# Patient Record
Sex: Female | Born: 1985 | Race: Black or African American | Hispanic: No | Marital: Single | Smoking: Never smoker
Health system: Southern US, Community
[De-identification: ages and names within clinical notes are randomized; demographics above are authoritative.]

---

## 2017-01-04 ENCOUNTER — Ambulatory Visit: Payer: Self-pay | Admitting: Family Medicine

## 2017-01-18 ENCOUNTER — Ambulatory Visit: Payer: Self-pay | Admitting: Family Medicine

## 2017-02-01 ENCOUNTER — Ambulatory Visit (INDEPENDENT_AMBULATORY_CARE_PROVIDER_SITE_OTHER): Payer: BC Managed Care – PPO | Admitting: Family Medicine

## 2017-02-01 ENCOUNTER — Encounter: Payer: Self-pay | Admitting: Family Medicine

## 2017-02-01 VITALS — BP 131/73 | HR 97 | Temp 98.4°F | Resp 16 | Ht 64.0 in | Wt 238.6 lb

## 2017-02-01 DIAGNOSIS — Z111 Encounter for screening for respiratory tuberculosis: Secondary | ICD-10-CM

## 2017-02-01 DIAGNOSIS — Z124 Encounter for screening for malignant neoplasm of cervix: Secondary | ICD-10-CM | POA: Diagnosis not present

## 2017-02-01 DIAGNOSIS — Z0000 Encounter for general adult medical examination without abnormal findings: Secondary | ICD-10-CM

## 2017-02-01 MED ORDER — CIPROFLOXACIN HCL 250 MG PO TABS
250.0000 mg | ORAL_TABLET | Freq: Two times a day (BID) | ORAL | 0 refills | Status: AC
Start: 2017-02-01 — End: 2017-02-04

## 2017-02-01 MED ORDER — FLUCONAZOLE 150 MG PO TABS: 150.0000 mg | ORAL_TABLET | Freq: Every day | ORAL | 0 refills | Status: DC

## 2017-02-01 NOTE — Patient Instructions (Addendum)
Antibiotic Medicine, Adult Antibiotic medicines treat infections caused by a type of germ called bacteria. They work by killing the bacteria that make you sick. When do I need to take antibiotics? You often need these medicines to treat bacterial infections, such as:  A urinary tract infection (UTI).  Strep throat.  Meningitis. This affects the spinal cord and brain.  A bad lung infection. You may start the medicines while your doctor waits for tests to come back. When the tests come back, your doctor may change or stop your medicine. When are antibiotics not needed? You do not need these medicines for most common illnesses, such as:  A cold.  The flu.  A sore throat. Antibiotics are not always needed for all infections caused by bacteria. Do not ask for these medicines, or take them, when they are not needed. What are the risks of taking antibiotics? Most antibiotics can cause an infection called Clostridium difficile.This causes watery poop (diarrhea). Let your doctor know right away if:  You have watery poop while taking an antibiotic.  You have watery poop after you stop taking an antibiotic. The illness can happen weeks after you stop the medicine. You also have a risk of getting an infection in the future that antibiotics cannot treat (antibiotic-resistant infection). This type of infection can be dangerous. What else should I know about taking antibiotics?  You need to take the entire prescription.  Take the medicine for as long as told by your doctor.  Do not stop taking it even if you start to feel better.  Try not to miss any doses. If you miss a dose, call your doctor.  Birth control pills may not work. If you take birth control pills:  Keep on taking them.  Use a second form of birth control, such as a condom. Do this for as long as told by your doctor.  Ask your doctor:  How long to wait in between doses.  If you should take the medicine with food.  If  there is anything you should stay away from while taking the antibiotic, such as:  Food.  Drinks.  Medicines.  If there are any side effects you should watch for.  Only take the medicines that your doctor told you to take. Do not take medicines that were given to someone else.  Drink a large glass of water with the medicine.  Ask the pharmacist for a tool to measure the medicine, such as:  A syringe.  A cup.  A spoon.  Throw away any extra medicine. Contact a doctor if:  You get worse.  You have new joint pain or muscle aches after starting the medicine.  You have side effects from the medicine, such as:  Stomach pain.  Watery poop.  Feeling sick to your stomach (nausea). Get help right away if:  You have signs of a very bad allergic reaction. If this happens, stop taking the medicine right away. Signs may include:  Hives. These are raised, itchy, red bumps on the skin.  Skin rash.  Trouble breathing.  Wheezing.  Swelling.  Feeling dizzy.  Throwing up (vomiting).  Your pee (urine) is dark, or is the color of blood.  Your skin turns yellow.  You bruise easily.  You bleed easily.  You have very bad watery poop and cramps in your belly.  You have a very bad headache. Summary  Antibiotics are often used to treat infections caused by bacteria.  Only take these medicines when needed.  Let your doctor know if you have watery poop while taking an antibiotic.  You need to take the entire prescription. This information is not intended to replace advice given to you by your health care provider. Make sure you discuss any questions you have with your health care provider. Document Released: 08/09/2008 Document Revised: 11/02/2016 Document Reviewed: 11/02/2016 Elsevier Interactive Patient Education  2017 [employer] Inc[employer]  Urinary Tract Infection, Adult A urinary tract infection (UTI) is an infection of any part of the urinary tract. The urinary tract  includes the:  Kidneys.  Ureters.  Bladder.  Urethra. These organs make, store, and get rid of pee (urine) in the body. Follow these instructions at home:  Take over-the-counter and prescription medicines only as told by your doctor.  If you were prescribed an antibiotic medicine, take it as told by your doctor. Do not stop taking the antibiotic even if you start to feel better.  Avoid the following drinks:  Alcohol.  Caffeine.  Tea.  Carbonated drinks.  Drink enough fluid to keep your pee clear or pale yellow.  Keep all follow-up visits as told by your doctor. This is important.  Make sure to:  Empty your bladder often and completely. Do not to hold pee for long periods of time.  Empty your bladder before and after sex.  Wipe from front to back after a bowel movement if you are female. Use each tissue one time when you wipe. Contact a doctor if:  You have back pain.  You have a fever.  You feel sick to your stomach (nauseous).  You throw up (vomit).  Your symptoms do not get better after 3 days.  Your symptoms go away and then come back. Get help right away if:  You have very bad back pain.  You have very bad lower belly (abdominal) pain.  You are throwing up and cannot keep down any medicines or water. This information is not intended to replace advice given to you by your health care provider. Make sure you discuss any questions you have with your health care provider. Document Released: 04/18/2008 Document Revised: 04/07/2016 Document Reviewed: 09/21/2015 Elsevier Interactive Patient Education  2017 [employer]

## 2017-02-01 NOTE — Progress Notes (Signed)
Chief Complaint  Patient presents with  . Annual Exam    No pap     Subjective:  Ashlee Fisher is a 31 y.o. female here for a health maintenance visit.  Patient is new pt  Patient Active Problem List   Diagnosis Date Noted  . Morbid obesity (HCC) 02/01/2017    No past medical history on file.  No past surgical history on file.   No outpatient prescriptions prior to visit.   No facility-administered medications prior to visit.     No Known Allergies   No family history on file.   Health Habits: Dental Exam: up to date Eye Exam: up to date   Social History   Social History  . Marital status: Single    Spouse name: N/A  . Number of children: N/A  . Years of education: N/A   Occupational History  . Not on file.   Social History Main Topics  . Smoking status: Never Smoker  . Smokeless tobacco: Never Used  . Alcohol use No  . Drug use: No  . Sexual activity: Not on file   Other Topics Concern  . Not on file   Social History Narrative  . No narrative on file   History  Alcohol Use No   History  Smoking Status  . Never Smoker  Smokeless Tobacco  . Never Used   History  Drug Use No    GYN: Sexual Health Menstrual status: regular menses LMP: Patient's last menstrual period was 01/19/2017. Last pap smear: see HM section History of abnormal pap smears: none Sexually active: with female partner Current contraception: condom  Health Maintenance: See under health Maintenance activity for review of completion dates as well. Immunization History  Administered Date(s) Administered  . PPD Test 02/01/2017      Depression Screen-PHQ2/9 Depression screen PHQ 2/9 02/01/2017  Decreased Interest 0  Down, Depressed, Hopeless 0  PHQ - 2 Score 0       Depression Severity and Treatment Recommendations:  0-4= None  5-9= Mild / Treatment: Support, educate to call if worse; return in one month  10-14= Moderate / Treatment: Support, watchful  waiting; Antidepressant or Psycotherapy  15-19= Moderately severe / Treatment: Antidepressant OR Psychotherapy  >= 20 = Major depression, severe / Antidepressant AND Psychotherapy    Review of Systems   Review of Systems  Constitutional: Negative for chills, fever, malaise/fatigue and weight loss.  HENT: Negative for ear discharge, ear pain, hearing loss and tinnitus.   Eyes: Negative for blurred vision and double vision.  Respiratory: Negative for cough, hemoptysis, sputum production, shortness of breath and wheezing.   Cardiovascular: Negative for chest pain, palpitations, orthopnea and claudication.  Gastrointestinal: Negative for heartburn, nausea and vomiting.  Genitourinary: Negative for dysuria, frequency, hematuria and urgency.  Musculoskeletal: Negative for back pain, myalgias and neck pain.  Skin: Negative for itching and rash.  Neurological: Negative for dizziness, tingling, tremors, sensory change, speech change and headaches.  Psychiatric/Behavioral: Negative for depression, hallucinations, substance abuse and suicidal ideas. The patient is not nervous/anxious.     See HPI for ROS as well.    Objective:   Vitals:   02/01/17 1725  BP: 131/73  Pulse: 97  Resp: 16  Temp: 98.4 F (36.9 C)  TempSrc: Oral  SpO2: 99%  Weight: 238 lb 9.6 oz (108.2 kg)  Height: 5\' 4"  (1.626 m)    Body mass index is 40.96 kg/m.  Physical Exam  Constitutional: She is oriented to person, place, and  time. She appears well-developed and well-nourished.  HENT:  Head: Normocephalic and atraumatic.  Right Ear: External ear normal.  Left Ear: External ear normal.  Nose: Nose normal.  Mouth/Throat: Oropharynx is clear and moist.  Eyes: Conjunctivae and EOM are normal. Right eye exhibits no discharge. Left eye exhibits no discharge.  Neck: Normal range of motion. Neck supple. No thyromegaly present.  Cardiovascular: Normal rate, regular rhythm and normal heart sounds.   Pulmonary/Chest:  Effort normal and breath sounds normal. No respiratory distress. She has no wheezes.  Abdominal: Soft. Bowel sounds are normal. She exhibits no distension and no mass. There is no tenderness. There is no rebound and no guarding.  Musculoskeletal: Normal range of motion. She exhibits no edema.  Neurological: She is alert and oriented to person, place, and time. She has normal reflexes. No cranial nerve deficit. Coordination normal.  Skin: Skin is warm. No rash noted. No erythema.  Psychiatric: She has a normal mood and affect. Her behavior is normal. Judgment and thought content normal.       Assessment/Plan:   Patient was seen for a health maintenance exam.  Counseled the patient on health maintenance issues. Reviewed her health mainteance schedule and ordered appropriate tests (see orders.) Counseled on regular exercise and weight management. Recommend regular eye exams and dental cleaning.   The following issues were addressed today for health maintenance:   [name] was seen today for annual exam.  Diagnoses and all orders for this visit:  Screening for cervical cancer- pt declined pap smear today, will return for pap  Health maintenance examination- age appropriate screenings reviewed  Morbid obesity (HCC)- discussed weight loss strategies  Screening-pulmonary TB- screening since pt is a [employer], [employer]/film/video -     TB Skin Test  Other orders -     ciprofloxacin (CIPRO) 250 MG tablet; Take 1 tablet (250 mg total) by mouth 2 (two) times daily. -     fluconazole (DIFLUCAN) 150 MG tablet; Take 1 tablet (150 mg total) by mouth daily. Repeat in 3 days. For yeast    No Follow-up on file.    Body mass index is 40.96 kg/m.:  Discussed the patient's BMI with patient. The BMI body mass index is 40.96 kg/m.     No future appointments.  Patient Instructions  Antibiotic Medicine, Adult Antibiotic medicines treat infections caused by a type of germ called bacteria. They work by killing the  bacteria that make you sick. When do I need to take antibiotics? You often need these medicines to treat bacterial infections, such as:  A urinary tract infection (UTI).  Strep throat.  Meningitis. This affects the spinal cord and brain.  A bad lung infection. You may start the medicines while your doctor waits for tests to come back. When the tests come back, your doctor may change or stop your medicine. When are antibiotics not needed? You do not need these medicines for most common illnesses, such as:  A cold.  The flu.  A sore throat. Antibiotics are not always needed for all infections caused by bacteria. Do not ask for these medicines, or take them, when they are not needed. What are the risks of taking antibiotics? Most antibiotics can cause an infection called Clostridium difficile.This causes watery poop (diarrhea). Let your doctor know right away if:  You have watery poop while taking an antibiotic.  You have watery poop after you stop taking an antibiotic. The illness can happen weeks after you stop the medicine. You also have a  risk of getting an infection in the future that antibiotics cannot treat (antibiotic-resistant infection). This type of infection can be dangerous. What else should I know about taking antibiotics?  You need to take the entire prescription.  Take the medicine for as long as told by your doctor.  Do not stop taking it even if you start to feel better.  Try not to miss any doses. If you miss a dose, call your doctor.  Birth control pills may not work. If you take birth control pills:  Keep on taking them.  Use a second form of birth control, such as a condom. Do this for as long as told by your doctor.  Ask your doctor:  How long to wait in between doses.  If you should take the medicine with food.  If there is anything you should stay away from while taking the antibiotic, such as:  Food.  Drinks.  Medicines.  If there are  any side effects you should watch for.  Only take the medicines that your doctor told you to take. Do not take medicines that were given to someone else.  Drink a large glass of water with the medicine.  Ask the pharmacist for a tool to measure the medicine, such as:  A syringe.  A cup.  A spoon.  Throw away any extra medicine. Contact a doctor if:  You get worse.  You have new joint pain or muscle aches after starting the medicine.  You have side effects from the medicine, such as:  Stomach pain.  Watery poop.  Feeling sick to your stomach (nausea). Get help right away if:  You have signs of a very bad allergic reaction. If this happens, stop taking the medicine right away. Signs may include:  Hives. These are raised, itchy, red bumps on the skin.  Skin rash.  Trouble breathing.  Wheezing.  Swelling.  Feeling dizzy.  Throwing up (vomiting).  Your pee (urine) is dark, or is the color of blood.  Your skin turns yellow.  You bruise easily.  You bleed easily.  You have very bad watery poop and cramps in your belly.  You have a very bad headache. Summary  Antibiotics are often used to treat infections caused by bacteria.  Only take these medicines when needed.  Let your doctor know if you have watery poop while taking an antibiotic.  You need to take the entire prescription. This information is not intended to replace advice given to you by your health care provider. Make sure you discuss any questions you have with your health care provider. Document Released: 08/09/2008 Document Revised: 11/02/2016 Document Reviewed: 11/02/2016 Elsevier Interactive Patient Education  2017 Else[employer] Inc.  Urinary Tract Infection, Adult A urinary tract infection (UTI) is an infection of any part of the urinary tract. The urinary tract includes the:  Kidneys.  Ureters.  Bladder.  Urethra. These organs make, store, and get rid of pee (urine) in the  body. Follow these instructions at home:  Take over-the-counter and prescription medicines only as told by your doctor.  If you were prescribed an antibiotic medicine, take it as told by your doctor. Do not stop taking the antibiotic even if you start to feel better.  Avoid the following drinks:  Alcohol.  Caffeine.  Tea.  Carbonated drinks.  Drink enough fluid to keep your pee clear or pale yellow.  Keep all follow-up visits as told by your doctor. This is important.  Make sure to:  Empty your  bladder often and completely. Do not to hold pee for long periods of time.  Empty your bladder before and after sex.  Wipe from front to back after a bowel movement if you are female. Use each tissue one time when you wipe. Contact a doctor if:  You have back pain.  You have a fever.  You feel sick to your stomach (nauseous).  You throw up (vomit).  Your symptoms do not get better after 3 days.  Your symptoms go away and then come back. Get help right away if:  You have very bad back pain.  You have very bad lower belly (abdominal) pain.  You are throwing up and cannot keep down any medicines or water. This information is not intended to replace advice given to you by your health care provider. Make sure you discuss any questions you have with your health care provider. Document Released: 04/18/2008 Document Revised: 04/07/2016 Document Reviewed: 09/21/2015 Elsevier Interactive Patient Education  2017 [employer]

## 2017-02-03 ENCOUNTER — Ambulatory Visit (INDEPENDENT_AMBULATORY_CARE_PROVIDER_SITE_OTHER): Payer: BC Managed Care – PPO | Admitting: Family Medicine

## 2017-02-03 DIAGNOSIS — Z111 Encounter for screening for respiratory tuberculosis: Secondary | ICD-10-CM

## 2017-02-03 LAB — TB SKIN TEST
INDURATION: 0 mm
TB SKIN TEST: NEGATIVE

## 2017-02-03 NOTE — Progress Notes (Signed)
Patient was here to have TB test read. Negative result.

## 2017-11-16 ENCOUNTER — Encounter: Payer: Self-pay | Admitting: Family Medicine

## 2017-11-16 ENCOUNTER — Ambulatory Visit: Payer: BC Managed Care – PPO | Admitting: Family Medicine

## 2017-11-16 ENCOUNTER — Other Ambulatory Visit: Payer: Self-pay

## 2017-11-16 VITALS — BP 124/82 | HR 100 | Temp 98.8°F | Resp 17 | Ht 64.0 in | Wt 225.8 lb

## 2017-11-16 DIAGNOSIS — R399 Unspecified symptoms and signs involving the genitourinary system: Secondary | ICD-10-CM

## 2017-11-16 DIAGNOSIS — N3000 Acute cystitis without hematuria: Secondary | ICD-10-CM

## 2017-11-16 LAB — POCT URINALYSIS DIP (MANUAL ENTRY)
BILIRUBIN UA: NEGATIVE
GLUCOSE UA: NEGATIVE mg/dL
Nitrite, UA: POSITIVE — AB
Protein Ur, POC: NEGATIVE mg/dL
Urobilinogen, UA: 0.2 E.U./dL
pH, UA: 6 (ref 5.0–8.0)

## 2017-11-16 MED ORDER — SULFAMETHOXAZOLE-TRIMETHOPRIM 800-160 MG PO TABS: 1.0000 | ORAL_TABLET | Freq: Two times a day (BID) | ORAL | 0 refills | Status: AC

## 2017-11-16 NOTE — Patient Instructions (Addendum)
   IF you received an x-ray today, you will receive an invoice from [facility]. Please contact [facility] at [phone] with questions or concerns regarding your invoice.   IF you received labwork today, you will receive an invoice from [facility]. Please contact [facility] at [phone] with questions or concerns regarding your invoice.   Our billing staff will not be able to assist you with questions regarding bills from these companies.  You will be contacted with the lab results as soon as they are available. The fastest way to get your results is to activate your My Chart account. Instructions are located on the last page of this paperwork. If you have not heard from us regarding the results in 2 weeks, please contact this office.      Urinary Tract Infection, Adult A urinary tract infection (UTI) is an infection of any part of the urinary tract, which includes the kidneys, ureters, bladder, and urethra. These organs make, store, and get rid of urine in the body. UTI can be a bladder infection (cystitis) or kidney infection (pyelonephritis). What are the causes? This infection may be caused by fungi, viruses, or bacteria. Bacteria are the most common cause of UTIs. This condition can also be caused by repeated incomplete emptying of the bladder during urination. What increases the risk? This condition is more likely to develop if:  You ignore your need to urinate or hold urine for long periods of time.  You do not empty your bladder completely during urination.  You wipe back to front after urinating or having a bowel movement, if you are female.  You are uncircumcised, if you are female.  You are constipated.  You have a urinary catheter that stays in place (indwelling).  You have a weak defense (immune) system.  You have a medical condition that affects your bowels, kidneys, or bladder.  You have diabetes.  You take antibiotic medicines frequently or  for long periods of time, and the antibiotics no longer work well against certain types of infections (antibiotic resistance).  You take medicines that irritate your urinary tract.  You are exposed to chemicals that irritate your urinary tract.  You are female.  What are the signs or symptoms? Symptoms of this condition include:  Fever.  Frequent urination or passing small amounts of urine frequently.  Needing to urinate urgently.  Pain or burning with urination.  Urine that smells bad or unusual.  Cloudy urine.  Pain in the lower abdomen or back.  Trouble urinating.  Blood in the urine.  Vomiting or being less hungry than normal.  Diarrhea or abdominal pain.  Vaginal discharge, if you are female.  How is this diagnosed? This condition is diagnosed with a medical history and physical exam. You will also need to provide a urine sample to test your urine. Other tests may be done, including:  Blood tests.  Sexually transmitted disease (STD) testing.  If you have had more than one UTI, a cystoscopy or imaging studies may be done to determine the cause of the infections. How is this treated? Treatment for this condition often includes a combination of two or more of the following:  Antibiotic medicine.  Other medicines to treat less common causes of UTI.  Over-the-counter medicines to treat pain.  Drinking enough water to stay hydrated.  Follow these instructions at home:  Take over-the-counter and prescription medicines only as told by your health care provider.  If you were prescribed an antibiotic, take it as   told by your health care provider. Do not stop taking the antibiotic even if you start to feel better.  Avoid alcohol, caffeine, tea, and carbonated beverages. They can irritate your bladder.  Drink enough fluid to keep your urine clear or pale yellow.  Keep all follow-up visits as told by your health care provider. This is important.  Make sure  to: ? Empty your bladder often and completely. Do not hold urine for long periods of time. ? Empty your bladder before and after sex. ? Wipe from front to back after a bowel movement if you are female. Use each tissue one time when you wipe. Contact a health care provider if:  You have back pain.  You have a fever.  You feel nauseous or vomit.  Your symptoms do not get better after 3 days.  Your symptoms go away and then return. Get help right away if:  You have severe back pain or lower abdominal pain.  You are vomiting and cannot keep down any medicines or water. This information is not intended to replace advice given to you by your health care provider. Make sure you discuss any questions you have with your health care provider. Document Released: 08/10/2005 Document Revised: 04/13/2016 Document Reviewed: 09/21/2015 Elsevier Interactive Patient Education  2018 [employer] Inc[employer]  

## 2017-11-16 NOTE — Progress Notes (Signed)
Chief Complaint  Patient presents with  . Cystitis    HPI Pt had a uti 07/2017 Urine culture was E. Coli  This work up was done at [facility] Treated with Keflex  2 days ago she started having similar symptoms of dysuria and flank pain No fevers No hematuria No vaginal discharge Increased urination was noted She increased her water intake but that did not help   History reviewed. No pertinent past medical history.  Current Outpatient Medications  Medication Sig Dispense Refill  . fluconazole (DIFLUCAN) 150 MG tablet Take 1 tablet (150 mg total) by mouth daily. Repeat in 3 days. For yeast 2 tablet 0  . sulfamethoxazole-trimethoprim (BACTRIM DS,SEPTRA DS) 800-160 MG tablet Take 1 tablet by mouth 2 (two) times daily for 7 days. 14 tablet 0   No current facility-administered medications for this visit.     Allergies: No Known Allergies  History reviewed. No pertinent surgical history.  Social History   Socioeconomic History  . Marital status: Single    Spouse name: None  . Number of children: None  . Years of education: None  . Highest education level: None  Social Needs  . Financial resource strain: None  . Food insecurity - worry: None  . Food insecurity - inability: None  . Transportation needs - medical: None  . Transportation needs - non-medical: None  Occupational History  . None  Tobacco Use  . Smoking status: Never Smoker  . Smokeless tobacco: Never Used  Substance and Sexual Activity  . Alcohol use: No  . Drug use: No  . Sexual activity: None  Other Topics Concern  . None  Social History Narrative  . None    Family History  Problem Relation Age of Onset  . Diabetes Neg Hx      ROS Review of Systems See HPI Constitution: No fevers or chills No malaise No diaphoresis Skin: No rash or itching Eyes: no blurry vision, no double vision GU: see hpi Neuro: no dizziness or headaches  all others reviewed and negative   Objective: Vitals:   11/16/17 1622  BP: 124/82  Pulse: 100  Resp: 17  Temp: 98.8 F (37.1 C)  TempSrc: Oral  SpO2: 98%  Weight: 225 lb 12.8 oz (102.4 kg)  Height: 5\' 4"  (1.626 m)    Physical Exam Physical Exam  Constitutional: She is oriented to person, place, and time. She appears well-developed and well-nourished.  HENT:  Head: Normocephalic and atraumatic.  Eyes: Conjunctivae and EOM are normal.  Cardiovascular: Normal rate, regular rhythm and normal heart sounds.   Pulmonary/Chest: Effort normal and breath sounds normal. No respiratory distress. She has no wheezes.  Abdominal: Normal appearance and bowel sounds are normal. There is suprapubic tenderness. There is no CVA tenderness.  Neurological: She is alert and oriented to person, place, and time.   Component     Latest Ref Rng & Units 11/16/2017 11/16/2017         4:23 PM  4:23 PM  Color, UA     yellow yellow   Clarity, UA     clear cloudy (A)   Glucose     negative mg/dL negative   Bilirubin, UA     negative negative trace (5) (A)  Specific Gravity, UA     1.010 - 1.025 >=1.030 (A)   RBC, UA     negative trace-lysed (A)   pH, UA     5.0 - 8.0 6.0   Protein, UA     negative  mg/dL negative   Urobilinogen, UA     0.2 or 1.0 E.U./dL 0.2   Nitrite, UA     Negative Positive (A)   Leukocytes, UA     Negative Trace (A)     Assessment and Plan Sierrah was seen today for cystitis.  Diagnoses and all orders for this visit:  Urinary symptom or sign -     POCT urinalysis dipstick  Acute cystitis without hematuria- will treat with bactrim Discussed treatment Bactrim used  Discussed disulfram reaction -     Urine Culture  Other orders -     Cancel: Tdap vaccine greater than or equal to 7yo IM -     Cancel: Flu Vaccine QUAD 36+ mos IM -     sulfamethoxazole-trimethoprim (BACTRIM DS,SEPTRA DS) 800-160 MG tablet; Take 1 tablet by mouth 2 (two) times daily for 7 days.     Breunna Nordmann A Lolah Coghlan

## 2017-11-18 LAB — URINE CULTURE

## 2017-12-20 ENCOUNTER — Ambulatory Visit: Payer: BC Managed Care – PPO | Admitting: Family Medicine

## 2017-12-20 NOTE — Progress Notes (Signed)
Chief Complaint  Patient presents with  . possible bladder infection    x few days, no fever    HPI  Pt reports that she had a uti a month ago she took bactrim ds which the urine culture showed that she was susceptible to She reports that for the past 2-3 days she had uti symptoms with foul odor, pressure at the end of the urine stream  No back pain, fevers or chills or nausea No pyelonephritis  She drinks plenty of water She does not douche, no fragrant soaps, no bubble baths No new sexual partners, she does anal sex with her boyfriend and she is wondering if that is why   No past medical history on file.  Current Outpatient Medications  Medication Sig Dispense Refill  . nitrofurantoin, macrocrystal-monohydrate, (MACROBID) 100 MG capsule Take 1 capsule (100 mg total) by mouth 2 (two) times daily for 7 days. 14 capsule 0   No current facility-administered medications for this visit.     Allergies: No Known Allergies  No past surgical history on file.  Social History   Socioeconomic History  . Marital status: Single    Spouse name: Not on file  . Number of children: Not on file  . Years of education: Not on file  . Highest education level: Not on file  Social Needs  . Financial resource strain: Not on file  . Food insecurity - worry: Not on file  . Food insecurity - inability: Not on file  . Transportation needs - medical: Not on file  . Transportation needs - non-medical: Not on file  Occupational History  . Not on file  Tobacco Use  . Smoking status: Never Smoker  . Smokeless tobacco: Never Used  Substance and Sexual Activity  . Alcohol use: No  . Drug use: No  . Sexual activity: Not on file  Other Topics Concern  . Not on file  Social History Narrative  . Not on file    Family History  Problem Relation Age of Onset  . Diabetes Neg Hx      ROS Review of Systems See HPI Constitution: No fevers or chills No malaise No diaphoresis Skin: No rash  or itching Eyes: no blurry vision, no double vision Neuro: no dizziness or headaches  all others reviewed and negative   Objective: Vitals:   12/21/17 1650  BP: 124/86  Pulse: 93  Resp: 16  Temp: 98.6 F (37 C)  TempSrc: Oral  SpO2: 100%  Weight: 216 lb 9.6 oz (98.2 kg)  Height: 5\' 4"  (1.626 m)   Wt Readings from Last 3 Encounters:  12/21/17 216 lb 9.6 oz (98.2 kg)  11/16/17 225 lb 12.8 oz (102.4 kg)  02/01/17 238 lb 9.6 oz (108.2 kg)    Physical Exam Physical Exam  Constitutional: She is oriented to person, place, and time. She appears well-developed and well-nourished.  HENT:  Head: Normocephalic and atraumatic.  Eyes: Conjunctivae and EOM are normal.  Cardiovascular: Normal rate, regular rhythm and normal heart sounds.   Pulmonary/Chest: Effort normal and breath sounds normal. No respiratory distress. She has no wheezes.  Abdominal: Normal appearance and bowel sounds are normal. There is suprapubic tenderness. There is no CVA tenderness.  Neurological: She is alert and oriented to person, place, and time.    Assessment and Plan Duwayne HeckDanielle was seen today for possible bladder infection.  Diagnoses and all orders for this visit:  Dysuria -     POCT urinalysis dipstick  Recurrent UTI -  GC/Chlamydia Probe Amp; Future -     POCT glycosylated hemoglobin (Hb A1C); Future -     Urine Culture  Acute cystitis without hematuria -     Urine Culture  Screening for diabetes mellitus -     POCT glycosylated hemoglobin (Hb A1C); Future  Other orders -     nitrofurantoin, macrocrystal-monohydrate, (MACROBID) 100 MG capsule; Take 1 capsule (100 mg total) by mouth 2 (two) times daily for 7 days.   Will treat with macrobid Pt to stop anal intercourse to see if the infection clears up Given other mneds and advised hydration  Flem Enderle A Creta Levin

## 2017-12-20 NOTE — Progress Notes (Deleted)
  No chief complaint on file.   HPI  4 review of systems  No past medical history on file.  Current Outpatient Medications  Medication Sig Dispense Refill  . fluconazole (DIFLUCAN) 150 MG tablet Take 1 tablet (150 mg total) by mouth daily. Repeat in 3 days. For yeast 2 tablet 0   No current facility-administered medications for this visit.     Allergies: No Known Allergies  No past surgical history on file.  Social History   Socioeconomic History  . Marital status: Single    Spouse name: Not on file  . Number of children: Not on file  . Years of education: Not on file  . Highest education level: Not on file  Social Needs  . Financial resource strain: Not on file  . Food insecurity - worry: Not on file  . Food insecurity - inability: Not on file  . Transportation needs - medical: Not on file  . Transportation needs - non-medical: Not on file  Occupational History  . Not on file  Tobacco Use  . Smoking status: Never Smoker  . Smokeless tobacco: Never Used  Substance and Sexual Activity  . Alcohol use: No  . Drug use: No  . Sexual activity: Not on file  Other Topics Concern  . Not on file  Social History Narrative  . Not on file    Family History  Problem Relation Age of Onset  . Diabetes Neg Hx      ROS Review of Systems See HPI Constitution: No fevers or chills No malaise No diaphoresis Skin: No rash or itching Eyes: no blurry vision, no double vision GU: no dysuria or hematuria Neuro: no dizziness or headaches * all others reviewed and negative   Objective: There were no vitals filed for this visit.  Physical Exam  Assessment and Plan There are no diagnoses linked to this encounter.   Ashlee Fisher P [employer] Corporation[employer]

## 2017-12-21 ENCOUNTER — Ambulatory Visit: Payer: BC Managed Care – PPO | Admitting: Family Medicine

## 2017-12-21 ENCOUNTER — Other Ambulatory Visit: Payer: Self-pay

## 2017-12-21 VITALS — BP 124/86 | HR 93 | Temp 98.6°F | Resp 16 | Ht 64.0 in | Wt 216.6 lb

## 2017-12-21 DIAGNOSIS — N390 Urinary tract infection, site not specified: Secondary | ICD-10-CM | POA: Diagnosis not present

## 2017-12-21 DIAGNOSIS — R300 Dysuria: Secondary | ICD-10-CM

## 2017-12-21 DIAGNOSIS — N3000 Acute cystitis without hematuria: Secondary | ICD-10-CM

## 2017-12-21 DIAGNOSIS — Z131 Encounter for screening for diabetes mellitus: Secondary | ICD-10-CM

## 2017-12-21 LAB — POCT URINALYSIS DIP (MANUAL ENTRY)
Bilirubin, UA: NEGATIVE
Blood, UA: NEGATIVE
GLUCOSE UA: NEGATIVE mg/dL
NITRITE UA: POSITIVE — AB
Spec Grav, UA: 1.02 (ref 1.010–1.025)
UROBILINOGEN UA: 1 U/dL
pH, UA: 8.5 — AB (ref 5.0–8.0)

## 2017-12-21 MED ORDER — NITROFURANTOIN MONOHYD MACRO 100 MG PO CAPS: 100.0000 mg | ORAL_CAPSULE | Freq: Two times a day (BID) | ORAL | 0 refills | Status: AC

## 2017-12-21 NOTE — Patient Instructions (Addendum)
     IF you received an x-ray today, you will receive an invoice from [facility]. Please contact [facility] at [phone] with questions or concerns regarding your invoice.   IF you received labwork today, you will receive an invoice from [facility]. Please contact [facility] at [phone] with questions or concerns regarding your invoice.   Our billing staff will not be able to assist you with questions regarding bills from these companies.  You will be contacted with the lab results as soon as they are available. The fastest way to get your results is to activate your My Chart account. Instructions are located on the last page of this paperwork. If you have not heard from us regarding the results in 2 weeks, please contact this office.     Urinary Tract Infection, Adult A urinary tract infection (UTI) is an infection of any part of the urinary tract. The urinary tract includes the:  Kidneys.  Ureters.  Bladder.  Urethra.  These organs make, store, and get rid of pee (urine) in the body. Follow these instructions at home:  Take over-the-counter and prescription medicines only as told by your doctor.  If you were prescribed an antibiotic medicine, take it as told by your doctor. Do not stop taking the antibiotic even if you start to feel better.  Avoid the following drinks: ? Alcohol. ? Caffeine. ? Tea. ? Carbonated drinks.  Drink enough fluid to keep your pee clear or pale yellow.  Keep all follow-up visits as told by your doctor. This is important.  Make sure to: ? Empty your bladder often and completely. Do not to hold pee for long periods of time. ? Empty your bladder before and after sex. ? Wipe from front to back after a bowel movement if you are female. Use each tissue one time when you wipe. Contact a doctor if:  You have back pain.  You have a fever.  You feel sick to your stomach (nauseous).  You throw up (vomit).  Your symptoms do  not get better after 3 days.  Your symptoms go away and then come back. Get help right away if:  You have very bad back pain.  You have very bad lower belly (abdominal) pain.  You are throwing up and cannot keep down any medicines or water. This information is not intended to replace advice given to you by your health care provider. Make sure you discuss any questions you have with your health care provider. Document Released: 04/18/2008 Document Revised: 04/07/2016 Document Reviewed: 09/21/2015 Elsevier Interactive Patient Education  2018 Elsevier Inc.  

## 2017-12-23 LAB — URINE CULTURE

## 2018-04-13 ENCOUNTER — Ambulatory Visit: Payer: BC Managed Care – PPO | Admitting: Family Medicine

## 2018-04-16 ENCOUNTER — Ambulatory Visit: Payer: BC Managed Care – PPO | Admitting: Family Medicine

## 2018-04-16 NOTE — Progress Notes (Deleted)
  No chief complaint on file.   HPI  4 review of systems  No past medical history on file.  No current outpatient medications on file.   No current facility-administered medications for this visit.     Allergies: No Known Allergies  No past surgical history on file.  Social History   Socioeconomic History  . Marital status: Single    Spouse name: Not on file  . Number of children: Not on file  . Years of education: Not on file  . Highest education level: Not on file  Occupational History  . Not on file  Social Needs  . Financial resource strain: Not on file  . Food insecurity:    Worry: Not on file    Inability: Not on file  . Transportation needs:    Medical: Not on file    Non-medical: Not on file  Tobacco Use  . Smoking status: Never Smoker  . Smokeless tobacco: Never Used  Substance and Sexual Activity  . Alcohol use: No  . Drug use: No  . Sexual activity: Not on file  Lifestyle  . Physical activity:    Days per week: Not on file    Minutes per session: Not on file  . Stress: Not on file  Relationships  . Social connections:    Talks on phone: Not on file    Gets together: Not on file    Attends religious service: Not on file    Active member of club or organization: Not on file    Attends meetings of clubs or organizations: Not on file    Relationship status: Not on file  Other Topics Concern  . Not on file  Social History Narrative  . Not on file    Family History  Problem Relation Age of Onset  . Diabetes Neg Hx      ROS Review of Systems See HPI Constitution: No fevers or chills No malaise No diaphoresis Skin: No rash or itching Eyes: no blurry vision, no double vision GU: no dysuria or hematuria Neuro: no dizziness or headaches * all others reviewed and negative   Objective: There were no vitals filed for this visit.  Physical Exam  Assessment and Plan There are no diagnoses linked to this encounter.   Ashlee Fisher P [employer]

## 2018-04-18 ENCOUNTER — Ambulatory Visit (INDEPENDENT_AMBULATORY_CARE_PROVIDER_SITE_OTHER): Payer: BC Managed Care – PPO

## 2018-04-18 ENCOUNTER — Ambulatory Visit: Payer: BC Managed Care – PPO | Admitting: Physician Assistant

## 2018-04-18 ENCOUNTER — Other Ambulatory Visit: Payer: Self-pay

## 2018-04-18 ENCOUNTER — Encounter: Payer: Self-pay | Admitting: Physician Assistant

## 2018-04-18 VITALS — BP 110/80 | HR 81 | Temp 98.7°F | Ht 64.0 in | Wt 192.0 lb

## 2018-04-18 DIAGNOSIS — M25531 Pain in right wrist: Secondary | ICD-10-CM

## 2018-04-18 LAB — POCT CBC
GRANULOCYTE PERCENT: 52.9 % (ref 37–80)
HEMATOCRIT: 38.7 % (ref 37.7–47.9)
Hemoglobin: 12.5 g/dL (ref 12.2–16.2)
LYMPH, POC: 2.9 (ref 0.6–3.4)
MCH, POC: 27.2 pg (ref 27–31.2)
MCHC: 32.2 g/dL (ref 31.8–35.4)
MCV: 84.4 fL (ref 80–97)
MID (CBC): 0.2 (ref 0–0.9)
MPV: 10.4 fL (ref 0–99.8)
PLATELET COUNT, POC: 195 10*3/uL (ref 142–424)
POC GRANULOCYTE: 3.5 (ref 2–6.9)
POC LYMPH %: 44 % (ref 10–50)
POC MID %: 3.1 %M (ref 0–12)
RBC: 4.59 M/uL (ref 4.04–5.48)
RDW, POC: 14.6 %
WBC: 6.6 10*3/uL (ref 4.6–10.2)

## 2018-04-18 MED ORDER — CEPHALEXIN 500 MG PO CAPS: 500.0000 mg | ORAL_CAPSULE | Freq: Four times a day (QID) | ORAL | 0 refills | Status: DC

## 2018-04-18 MED ORDER — NAPROXEN 500 MG PO TABS: 500.0000 mg | ORAL_TABLET | Freq: Two times a day (BID) | ORAL | 0 refills | Status: DC

## 2018-04-18 MED ORDER — TRAMADOL HCL 50 MG PO TABS: 50.0000 mg | ORAL_TABLET | Freq: Three times a day (TID) | ORAL | 0 refills | Status: AC | PRN

## 2018-04-18 NOTE — Progress Notes (Signed)
    04/18/2018 2:03 PM   DOB: 08/18/1986 / MRN: [id number]  SUBJECTIVE:  Ashlee Fisher is a 32 y.o. female presenting for right sided posterior wrist pain that started yesterday and is worsening.  Patient associates warmth and redness about the area.  She does have an old burn in the area of concern however this is been healing well.  She denies fever, chills, nausea.  She has a history of gout and in fact has been losing weight on weight watchers.  Moving the hand makes the pain worse on resting the hand makes it better.  She has No Known Allergies.   She  has no past medical history on file.    She  reports that she has never smoked. She has never used smokeless tobacco. She reports that she does not drink alcohol or use drugs. She  has no sexual activity history on file. The patient  has no past surgical history on file.  Her family history is not on file.  Review of Systems  Constitutional: Negative for chills, diaphoresis and fever.  Eyes: Negative.   Gastrointestinal: Negative for nausea.  Skin: Negative for rash.  Neurological: Negative for dizziness, sensory change, speech change, focal weakness and headaches.    The problem list and medications were reviewed and updated by myself where necessary and exist elsewhere in the encounter.   OBJECTIVE:  BP 110/80 (BP Location: Left Arm, Patient Position: Sitting, Cuff Size: Normal)   Pulse 81   Temp 98.7 F (37.1 C) (Oral)   Ht 5\' 4"  (1.626 m)   Wt 192 lb (87.1 kg)   SpO2 100%   BMI 32.96 kg/m   Physical Exam  Constitutional: She is oriented to person, place, and time. She appears well-nourished. No distress.  Eyes: Pupils are equal, round, and reactive to light. EOM are normal.  Cardiovascular: Normal rate.  Pulmonary/Chest: Effort normal.  Abdominal: She exhibits no distension.  Musculoskeletal:       Hands: Neurological: She is alert and oriented to person, place, and time. No cranial nerve deficit. Gait normal.    Skin: Skin is dry. She is not diaphoretic.  Psychiatric: She has a normal mood and affect.  Vitals reviewed.   No results found for this or any previous visit (from the past 72 hour(s)).  No results found.  ASSESSMENT AND PLAN:  Ashlee Fisher was seen today for right hand and arm.  Diagnoses and all orders for this visit:  Right wrist pain: Interesting presentation.  There is warmth, redness.  Gout and CCP are certainly possible however she needs to be covered for potential cellulitis given that she does have some interruption in the integument be of the scar on her hand.  I am starting her on Keflex and will control her pain.  I will see her back in about 4 days. -     DG Wrist Complete Right; Future -     POCT CBC -     Sedimentation Rate -     Uric Acid    The patient is advised to call or return to clinic if she does not see an improvement in symptoms, or to seek the care of the closest emergency department if she worsens with the above plan.   Deliah BostonMichael Clark, MHS, PA-C [facility] 04/18/2018 2:03 PM

## 2018-04-18 NOTE — Patient Instructions (Addendum)
  Please pick up the antibiotic and the pain medications.  Throat antibiotic immediately and also take the naproxen.  You can take tramadol if you are having any breakthrough pain as it reads on the signature.  Please come back on Monday so I can take another look at the hand.  I will contact you if there were any abnormalities on the x-rays that are concerning or would change the management.   IF you received an x-ray today, you will receive an invoice from [facility]. Please contact [facility] at [phone] with questions or concerns regarding your invoice.   IF you received labwork today, you will receive an invoice from [facility]. Please contact [facility] at [phone] with questions or concerns regarding your invoice.   Our billing staff will not be able to assist you with questions regarding bills from these companies.  You will be contacted with the lab results as soon as they are available. The fastest way to get your results is to activate your My Chart account. Instructions are located on the last page of this paperwork. If you have not heard from us regarding the results in 2 weeks, please contact this office.

## 2018-04-19 LAB — URIC ACID: URIC ACID: 3.2 mg/dL (ref 2.5–7.1)

## 2018-04-19 LAB — SEDIMENTATION RATE: SED RATE: 63 mm/h — AB (ref 0–32)

## 2018-04-20 ENCOUNTER — Ambulatory Visit: Payer: BC Managed Care – PPO | Admitting: Family Medicine

## 2018-04-20 VITALS — BP 110/64 | HR 87 | Temp 98.1°F | Ht 64.0 in | Wt 190.4 lb

## 2018-04-20 DIAGNOSIS — R399 Unspecified symptoms and signs involving the genitourinary system: Secondary | ICD-10-CM | POA: Diagnosis not present

## 2018-04-20 LAB — POCT URINALYSIS DIP (MANUAL ENTRY)
Bilirubin, UA: NEGATIVE
GLUCOSE UA: NEGATIVE mg/dL
Ketones, POC UA: NEGATIVE mg/dL
NITRITE UA: NEGATIVE
Protein Ur, POC: NEGATIVE mg/dL
RBC UA: NEGATIVE
Spec Grav, UA: >=1.03 — AB (ref 1.010–1.025)
UROBILINOGEN UA: 0.2 U/dL
pH, UA: 5.5 (ref 5.0–8.0)

## 2018-04-20 NOTE — Progress Notes (Signed)
Chief Complaint  Patient presents with  . Cystitis    lower abominal pain    HPI   Pt reports a 3 days of UTI symptoms She had dysuria She denies flank pain or back pain Last UTI 2/19 was E. Coli susceptible to cephalosporins She report that she was started of Keflex qid 3 days ago  4 review of systems  No past medical history on file.  Current Outpatient Medications  Medication Sig Dispense Refill  . cephALEXin (KEFLEX) 500 MG capsule Take 1 capsule (500 mg total) by mouth 4 (four) times daily. 21 capsule 0  . naproxen (NAPROSYN) 500 MG tablet Take 1 tablet (500 mg total) by mouth 2 (two) times daily with a meal. 30 tablet 0  . traMADol (ULTRAM) 50 MG tablet Take 1 tablet (50 mg total) by mouth every 8 (eight) hours as needed (Take for breakthrough pain.). 12 tablet 0   No current facility-administered medications for this visit.     Allergies: No Known Allergies  No past surgical history on file.  Social History   Socioeconomic History  . Marital status: Single    Spouse name: Not on file  . Number of children: Not on file  . Years of education: Not on file  . Highest education level: Not on file  Occupational History  . Not on file  Social Needs  . Financial resource strain: Not on file  . Food insecurity:    Worry: Not on file    Inability: Not on file  . Transportation needs:    Medical: Not on file    Non-medical: Not on file  Tobacco Use  . Smoking status: Never Smoker  . Smokeless tobacco: Never Used  Substance and Sexual Activity  . Alcohol use: No  . Drug use: No  . Sexual activity: Not on file  Lifestyle  . Physical activity:    Days per week: Not on file    Minutes per session: Not on file  . Stress: Not on file  Relationships  . Social connections:    Talks on phone: Not on file    Gets together: Not on file    Attends religious service: Not on file    Active member of club or organization: Not on file    Attends meetings of clubs or  organizations: Not on file    Relationship status: Not on file  Other Topics Concern  . Not on file  Social History Narrative  . Not on file    Family History  Problem Relation Age of Onset  . Diabetes Neg Hx      ROS Review of Systems See HPI Constitution: No fevers or chills No malaise No diaphoresis Skin: No rash or itching Eyes: no blurry vision, no double vision GU: no dysuria or hematuria Neuro: no dizziness or headaches * all others reviewed and negative   Objective: Vitals:   04/20/18 1459  BP: 110/64  Pulse: 87  Temp: 98.1 F (36.7 C)  TempSrc: Oral  SpO2: 99%  Weight: 190 lb 6.4 oz (86.4 kg)  Height: 5\' 4"  (1.626 m)    Physical Exam Physical Exam  Constitutional: She is oriented to person, place, and time. She appears well-developed and well-nourished.  HENT:  Head: Normocephalic and atraumatic.  Eyes: Conjunctivae and EOM are normal.  Cardiovascular: Normal rate, regular rhythm and normal heart sounds.   Pulmonary/Chest: Effort normal and breath sounds normal. No respiratory distress. She has no wheezes.  Abdominal: Normal appearance and  bowel sounds are normal. There is no tenderness. There is no CVA tenderness.  Neurological: She is alert and oriented to person, place, and time.    Assessment and Plan Duwayne HeckDanielle was seen today for cystitis.  Diagnoses and all orders for this visit:  Urinary symptom or sign -     POCT urinalysis dipstick  continue keflex Add probiotic Continue hydration Would not change antibiotic UA not impressive, trace LE, neg nit    Login Muckleroy A Dempsey Knotek

## 2018-04-20 NOTE — Patient Instructions (Signed)
Wt Readings from Last 3 Encounters:  04/20/18 190 lb 6.4 oz (86.4 kg)  04/18/18 192 lb (87.1 kg)  12/21/17 216 lb 9.6 oz (98.2 kg)    Try Ultimate Flora Probiotic Women's Care  This is a helpful probiotic

## 2018-04-23 ENCOUNTER — Ambulatory Visit: Payer: BC Managed Care – PPO | Admitting: Family Medicine

## 2018-06-07 ENCOUNTER — Ambulatory Visit: Payer: BC Managed Care – PPO | Admitting: Family Medicine

## 2018-06-07 ENCOUNTER — Encounter: Payer: Self-pay | Admitting: Family Medicine

## 2018-06-07 ENCOUNTER — Other Ambulatory Visit: Payer: Self-pay

## 2018-06-07 VITALS — BP 100/72 | HR 96 | Temp 98.9°F | Resp 17 | Ht 64.0 in | Wt 187.8 lb

## 2018-06-07 DIAGNOSIS — J029 Acute pharyngitis, unspecified: Secondary | ICD-10-CM

## 2018-06-07 DIAGNOSIS — E669 Obesity, unspecified: Secondary | ICD-10-CM | POA: Diagnosis not present

## 2018-06-07 LAB — POCT RAPID STREP A (OFFICE): Rapid Strep A Screen: NEGATIVE

## 2018-06-07 NOTE — Patient Instructions (Addendum)
11/16/2017     225 lbs 12.8 oz 12/21/2017    216 lbs 9.6 oz 04/18/2018    192 lbs  06/07/2018  187 lbs 12.8 oz    IF you received an x-ray today, you will receive an invoice from [facility]. Please contact [facility] at [phone] with questions or concerns regarding your invoice.   IF you received labwork today, you will receive an invoice from [facility]. Please contact [facility] at [phone] with questions or concerns regarding your invoice.   Our billing staff will not be able to assist you with questions regarding bills from these companies.  You will be contacted with the lab results as soon as they are available. The fastest way to get your results is to activate your My Chart account. Instructions are located on the last page of this paperwork. If you have not heard from us regarding the results in 2 weeks, please contact this office.     Sore Throat A sore throat is pain, burning, irritation, or scratchiness in the throat. When you have a sore throat, you may feel pain or tenderness in your throat when you swallow or talk. Many things can cause a sore throat, including:  An infection.  Seasonal allergies.  Dryness in the air.  Irritants, such as smoke or pollution.  Gastroesophageal reflux disease (GERD).  A tumor.  A sore throat is often the first sign of another sickness. It may happen with other symptoms, such as coughing, sneezing, fever, and swollen neck glands. Most sore throats go away without medical treatment. Follow these instructions at home:  Take over-the-counter medicines only as told by your health care provider.  Drink enough fluids to keep your urine clear or pale yellow.  Rest as needed.  To help with pain, try: ? Sipping warm liquids, such as broth, herbal tea, or warm water. ? Eating or drinking cold or frozen liquids, such as frozen ice pops. ? Gargling with a salt-water mixture 3-4 times a day or as needed. To make a  salt-water mixture, completely dissolve -1 tsp of salt in 1 cup of warm water. ? Sucking on hard candy or throat lozenges. ? Putting a cool-mist humidifier in your bedroom at night to moisten the air. ? Sitting in the bathroom with the door closed for 5-10 minutes while you run hot water in the shower.  Do not use any tobacco products, such as cigarettes, chewing tobacco, and e-cigarettes. If you need help quitting, ask your health care provider. Contact a health care provider if:  You have a fever for more than 2-3 days.  You have symptoms that last (are persistent) for more than 2-3 days.  Your throat does not get better within 7 days.  You have a fever and your symptoms suddenly get worse. Get help right away if:  You have difficulty breathing.  You cannot swallow fluids, soft foods, or your saliva.  You have increased swelling in your throat or neck.  You have persistent nausea and vomiting. This information is not intended to replace advice given to you by your health care provider. Make sure you discuss any questions you have with your health care provider. Document Released: 12/08/2004 Document Revised: 06/26/2016 Document Reviewed: 08/21/2015 Elsevier Interactive Patient Education  [employer]2018 Elsevier Inc.

## 2018-06-07 NOTE — Progress Notes (Signed)
Chief Complaint  Patient presents with  . sore throat and white flecks on throat    x yesterday morning upon waking up throat felt weird then sore, no ha's/fever.  No otc meds for pain    HPI  Sore throat Patient reports that yesterday she woke up with hoarseness and phlegm She reports that she had no other signs of a cold This morning she woke up and saw some white mucus on the gland  She denies cough, sick contacts, or otalgia.  Weight loss Pt reports that she works out and limits her intake of fatty foods, late night snacking and eating out at restaurants.  She reports that she wants to lose 30 pounds more She is working on toning her muscles She feels good about her progress  11/16/2017     225 lbs 12.8 oz 12/21/2017    216 lbs 9.6 oz 04/18/2018    192 lbs  06/07/2018  187 lbs 12.8 oz No past medical history on file.  Current Outpatient Medications  Medication Sig Dispense Refill  . naproxen (NAPROSYN) 500 MG tablet Take 1 tablet (500 mg total) by mouth 2 (two) times daily with a meal. 30 tablet 0  . traMADol (ULTRAM) 50 MG tablet Take 1 tablet (50 mg total) by mouth every 8 (eight) hours as needed (Take for breakthrough pain.). (Patient not taking: Reported on 06/07/2018) 12 tablet 0   No current facility-administered medications for this visit.     Allergies: No Known Allergies  No past surgical history on file.  Social History   Socioeconomic History  . Marital status: Single    Spouse name: Not on file  . Number of children: Not on file  . Years of education: Not on file  . Highest education level: Not on file  Occupational History  . Not on file  Social Needs  . Financial resource strain: Not on file  . Food insecurity:    Worry: Not on file    Inability: Not on file  . Transportation needs:    Medical: Not on file    Non-medical: Not on file  Tobacco Use  . Smoking status: Never Smoker  . Smokeless tobacco: Never Used  Substance and Sexual Activity  .  Alcohol use: No  . Drug use: No  . Sexual activity: Not on file  Lifestyle  . Physical activity:    Days per week: Not on file    Minutes per session: Not on file  . Stress: Not on file  Relationships  . Social connections:    Talks on phone: Not on file    Gets together: Not on file    Attends religious service: Not on file    Active member of club or organization: Not on file    Attends meetings of clubs or organizations: Not on file    Relationship status: Not on file  Other Topics Concern  . Not on file  Social History Narrative  . Not on file    Family History  Problem Relation Age of Onset  . Diabetes Neg Hx      ROS Review of Systems See HPI Constitution: No fevers or chills No malaise No diaphoresis Skin: No rash or itching Eyes: no blurry vision, no double vision GU: no dysuria or hematuria Neuro: no dizziness or headaches all others reviewed and negative   Objective: Vitals:   06/07/18 1216  BP: 100/72  Pulse: 96  Resp: 17  Temp: 98.9 F (37.2 C)  TempSrc: Oral  SpO2: 99%  Weight: 187 lb 12.8 oz (85.2 kg)  Height: 5\' 4"  (1.626 m)   Wt Readings from Last 3 Encounters:  06/07/18 187 lb 12.8 oz (85.2 kg)  04/20/18 190 lb 6.4 oz (86.4 kg)  04/18/18 192 lb (87.1 kg)   Body mass index is 32.24 kg/m.  Physical Exam General: alert, oriented, in NAD Head: normocephalic, atraumatic, no sinus tenderness Eyes: EOM intact, no scleral icterus or conjunctival injection Ears: TM clear bilaterally Nose: mucosa nonerythematous, nonedematous Throat: no pharyngeal exudate or erythema Lymph: no posterior auricular, submental or cervical lymph adenopathy Heart: normal rate, normal sinus rhythm, no murmurs Lungs: clear to auscultation bilaterally, no wheezing   Assessment and Plan Yaeli was seen today for sore throat and white flecks on throat.  Diagnoses and all orders for this visit:  Sore throat -     POCT rapid strep A  Mild obesity -  Much  improved with diet and exercise -  Discussed BMI changes -  Discussed weight training  A total of 30 minutes were spent face-to-face with the patient during this encounter and over half of that time was spent on counseling and coordination of care.      Nghia Mcentee A Zannah Melucci

## 2018-06-08 ENCOUNTER — Ambulatory Visit: Payer: BC Managed Care – PPO | Admitting: Physician Assistant

## 2018-06-08 ENCOUNTER — Encounter: Payer: Self-pay | Admitting: Physician Assistant

## 2018-06-08 VITALS — BP 118/78 | HR 81 | Temp 97.6°F | Ht 64.0 in | Wt 188.8 lb

## 2018-06-08 DIAGNOSIS — J029 Acute pharyngitis, unspecified: Secondary | ICD-10-CM

## 2018-06-08 DIAGNOSIS — B379 Candidiasis, unspecified: Secondary | ICD-10-CM

## 2018-06-08 DIAGNOSIS — J020 Streptococcal pharyngitis: Secondary | ICD-10-CM | POA: Diagnosis not present

## 2018-06-08 MED ORDER — FLUCONAZOLE 150 MG PO TABS: 150.0000 mg | ORAL_TABLET | Freq: Once | ORAL | 0 refills | Status: AC

## 2018-06-08 MED ORDER — AMOXICILLIN 500 MG PO CAPS: 500.0000 mg | ORAL_CAPSULE | Freq: Two times a day (BID) | ORAL | 0 refills | Status: AC

## 2018-06-08 NOTE — Progress Notes (Addendum)
   Laverle PatterDanielle Bisono  MRN: [id number] DOB: 05/23/1986  PCP: Doristine BosworthStallings, Zoe A, MD  Subjective:  Pt is a 32 year old female who presents to clinic for sore throat x 3 days.   She was here yesterday for the same c/c and Dx with viral pharyngitis. She was gargling with salt water. Symptoms are worsening.  Throat is uncomfortable. A little pain with swallowing. Spitting up a lot of phlegm. No other symptoms.   Review of Systems  Constitutional: Negative for chills, diaphoresis, fatigue and fever.  HENT: Positive for sore throat and trouble swallowing. Negative for congestion, postnasal drip, rhinorrhea, sinus pressure and sinus pain.   Respiratory: Negative for cough, shortness of breath and wheezing.   Psychiatric/Behavioral: Negative for sleep disturbance.    Patient Active Problem List   Diagnosis Date Noted  . Morbid obesity (HCC) 02/01/2017    Current Outpatient Medications on File Prior to Visit  Medication Sig Dispense Refill  . naproxen (NAPROSYN) 500 MG tablet Take 1 tablet (500 mg total) by mouth 2 (two) times daily with a meal. 30 tablet 0  . traMADol (ULTRAM) 50 MG tablet Take 1 tablet (50 mg total) by mouth every 8 (eight) hours as needed (Take for breakthrough pain.). (Patient not taking: Reported on 06/07/2018) 12 tablet 0   No current facility-administered medications on file prior to visit.     No Known Allergies   Objective:  LMP 05/25/2018  Blood pressure 118/78, pulse 81, temperature 97.6 F (36.4 C), temperature source Oral, height 5\' 4"  (1.626 m), weight 188 lb 12.8 oz (85.6 kg), last menstrual period 05/25/2018, SpO2 98 %.  Physical Exam  Constitutional: She is oriented to person, place, and time. No distress.  HENT:  Right Ear: Tympanic membrane normal.  Left Ear: Tympanic membrane normal.  Nose: Mucosal edema present. No rhinorrhea. Right sinus exhibits no maxillary sinus tenderness and no frontal sinus tenderness. Left sinus exhibits no maxillary sinus  tenderness and no frontal sinus tenderness.  Mouth/Throat: Uvula is midline and mucous membranes are normal. Oropharyngeal exudate and posterior oropharyngeal edema present.  Pulmonary/Chest: Effort normal and breath sounds normal. No respiratory distress. She has no wheezes. She has no rales.  Neurological: She is alert and oriented to person, place, and time.  Skin: Skin is warm and dry.  Psychiatric: Judgment normal.  Vitals reviewed.   Assessment and Plan :  1. Streptococcal sore throat 2. Sore throat - Pt presents for f/u sore throat. She was here yesterday for the same c/c and Dx with viral pharyngitis.  - Culture, Group A Strep - amoxicillin (AMOXIL) 500 MG capsule; Take 1 capsule (500 mg total) by mouth 2 (two) times daily.  Dispense: 20 capsule; Refill: 0  3. Yeast infection - fluconazole (DIFLUCAN) 150 MG tablet; Take 1 tablet (150 mg total) by mouth once for 1 dose. Repeat if needed  Dispense: 2 tablet; Refill: 0  Whitney Leeandra Ellerson, PA-C  [facility] at [facility] 06/08/2018 1:42 PM  Please note: Portions of this report may have been transcribed using dragon voice recognition software. Every effort was made to ensure accuracy; however, inadvertent computerized transcription errors may be present.

## 2018-06-08 NOTE — Patient Instructions (Addendum)
  Start taking Amoxicillin twice daily x 10 days.   Stay well hydrated. Get lost of rest. Wash your hands often.   -Foods that can help speed recovery: honey, garlic, chicken soup, elderberries, green tea.  -Supplements that can help speed recovery: vitamin C, zinc, elderberry extract, quercetin, ginseng, selenium -Supplement with prebiotics and probiotics:   Advil or ibuprofen for pain. Do not take Aspirin.   For sore throat: ? Gargle with 8 oz of salt water ( tsp of salt per 1 qt of water) as often as every 1-2 hours to soothe your throat.  Gargle liquid benadryl.  Cepacol throat lozenges (if you are not at risk for choking).  For sore throat try using a honey-based tea. Use 3 teaspoons of honey with juice squeezed from half lemon. Place shaved pieces of ginger into 1/2-1 cup of water and warm over stove top. Then mix the ingredients and repeat every 4 hours as needed.  Thank you for coming in today. I hope you feel we met your needs.  Feel free to call PCP if you have any questions or further requests.  Please consider signing up for MyChart if you do not already have it, as this is a great way to communicate with me.  Best,  Whitney McVey, PA-C    IF you received an x-ray today, you will receive an invoice from [facility]. Please contact [facility] at [phone] with questions or concerns regarding your invoice.   IF you received labwork today, you will receive an invoice from [place]. Please contact [facility] at [phone] with questions or concerns regarding your invoice.   Our billing staff will not be able to assist you with questions regarding bills from these companies.  You will be contacted with the lab results as soon as they are available. The fastest way to get your results is to activate your My Chart account. Instructions are located on the last page of this paperwork. If you have not heard from [place] regarding the results in 2 weeks, please  contact this office.

## 2018-06-10 LAB — CULTURE, GROUP A STREP

## 2018-06-14 ENCOUNTER — Encounter: Payer: Self-pay | Admitting: Physician Assistant

## 2019-03-16 ENCOUNTER — Emergency Department (HOSPITAL_COMMUNITY)
Admission: EM | Admit: 2019-03-16 | Discharge: 2019-03-16 | Disposition: A | Discharge status: Home / Self Care | Payer: BC Managed Care – PPO | Attending: Emergency Medicine | Admitting: Emergency Medicine

## 2019-03-16 ENCOUNTER — Encounter (HOSPITAL_COMMUNITY): Payer: Self-pay

## 2019-03-16 ENCOUNTER — Other Ambulatory Visit: Payer: Self-pay

## 2019-03-16 DIAGNOSIS — R112 Nausea with vomiting, unspecified: Secondary | ICD-10-CM | POA: Insufficient documentation

## 2019-03-16 DIAGNOSIS — R1115 Cyclical vomiting syndrome unrelated to migraine: Secondary | ICD-10-CM | POA: Insufficient documentation

## 2019-03-16 DIAGNOSIS — R109 Unspecified abdominal pain: Secondary | ICD-10-CM | POA: Diagnosis present

## 2019-03-16 LAB — CBC WITH DIFFERENTIAL/PLATELET
Abs Immature Granulocytes: 0.01 10*3/uL (ref 0.00–0.07)
Basophils Absolute: 0 10*3/uL (ref 0.0–0.1)
Basophils Relative: 1 %
Eosinophils Absolute: 0.1 10*3/uL (ref 0.0–0.5)
Eosinophils Relative: 2 %
HCT: 40.3 % (ref 36.0–46.0)
Hemoglobin: 13 g/dL (ref 12.0–15.0)
Immature Granulocytes: 0 %
Lymphocytes Relative: 35 %
Lymphs Abs: 1.6 10*3/uL (ref 0.7–4.0)
MCH: 28.8 pg (ref 26.0–34.0)
MCHC: 32.3 g/dL (ref 30.0–36.0)
MCV: 89.2 fL (ref 80.0–100.0)
Monocytes Absolute: 0.4 10*3/uL (ref 0.1–1.0)
Monocytes Relative: 9 %
Neutro Abs: 2.3 10*3/uL (ref 1.7–7.7)
Neutrophils Relative %: 53 %
Platelets: 225 10*3/uL (ref 150–400)
RBC: 4.52 MIL/uL (ref 3.87–5.11)
RDW: 12.9 % (ref 11.5–15.5)
WBC: 4.4 10*3/uL (ref 4.0–10.5)
nRBC: 0 % (ref 0.0–0.2)

## 2019-03-16 LAB — COMPREHENSIVE METABOLIC PANEL
ALT: 11 U/L (ref 0–44)
AST: 20 U/L (ref 15–41)
Albumin: 3.9 g/dL (ref 3.5–5.0)
Alkaline Phosphatase: 48 U/L (ref 38–126)
Anion gap: 12 (ref 5–15)
BUN: 9 mg/dL (ref 6–20)
CO2: 21 mmol/L — ABNORMAL LOW (ref 22–32)
Calcium: 9.3 mg/dL (ref 8.9–10.3)
Chloride: 108 mmol/L (ref 98–111)
Creatinine, Ser: 0.92 mg/dL (ref 0.44–1.00)
GFR calc Af Amer: >60 mL/min (ref >60)
GFR calc non Af Amer: >60 mL/min (ref >60)
Glucose, Bld: 107 mg/dL — ABNORMAL HIGH (ref 70–99)
Potassium: 4.1 mmol/L (ref 3.5–5.1)
Sodium: 141 mmol/L (ref 135–145)
Total Bilirubin: 0.6 mg/dL (ref 0.3–1.2)
Total Protein: 7.6 g/dL (ref 6.5–8.1)

## 2019-03-16 LAB — I-STAT BETA HCG BLOOD, ED (MC, WL, AP ONLY): I-stat hCG, quantitative: <5 m[IU]/mL (ref <5)

## 2019-03-16 LAB — MAGNESIUM: Magnesium: 2.1 mg/dL (ref 1.7–2.4)

## 2019-03-16 LAB — LIPASE, BLOOD: Lipase: 23 U/L (ref 11–51)

## 2019-03-16 MED ORDER — SODIUM CHLORIDE 0.9 % IV BOLUS
1000.0000 mL | Freq: Once | INTRAVENOUS | Status: AC
  Administered 2019-03-16: 15:00:00 1000 mL via INTRAVENOUS

## 2019-03-16 MED ORDER — FAMOTIDINE IN NACL 20-0.9 MG/50ML-% IV SOLN
20.0000 mg | Freq: Once | INTRAVENOUS | Status: AC
  Administered 2019-03-16: 15:00:00 20 mg via INTRAVENOUS
  Filled 2019-03-16: qty 50

## 2019-03-16 MED ORDER — PROMETHAZINE HCL 25 MG PO TABS: 25.0000 mg | ORAL_TABLET | Freq: Four times a day (QID) | ORAL | 0 refills | Status: AC | PRN

## 2019-03-16 MED ORDER — HALOPERIDOL LACTATE 5 MG/ML IJ SOLN
2.0000 mg | Freq: Once | INTRAMUSCULAR | Status: AC
  Administered 2019-03-16: 2 mg via INTRAVENOUS
  Filled 2019-03-16: qty 1

## 2019-03-16 NOTE — Discharge Instructions (Signed)
If you are unable to keep the phenergan pill down by mouth, you may insert the pill in your bottom as a suppository .   Get help right away if: You have pain in your chest, neck, arm, or jaw. You feel extremely weak or you faint. You have persistent vomiting. You have vomit that is bright red or looks like black coffee grounds. You have bloody or black stools or stools that look like tar. You have a severe headache, a stiff neck, or both. You have severe pain, cramping, or bloating in your abdomen. You have difficulty breathing, or you are breathing very quickly. Your heart is beating very quickly. Your skin feels cold and clammy. You feel confused. You have signs of dehydration, such as: Dark urine, very little urine, or no urine. Cracked lips. Dry mouth. Sunken eyes. Sleepiness. Weakness.

## 2019-03-16 NOTE — ED Notes (Signed)
Pt given urine specimen cup when she ambulated to bathroom pt stated she couldn't do anything with it.

## 2019-03-16 NOTE — ED Triage Notes (Signed)
Pt presents for nausea and vomiting since this am. Pt states she was drinking last night and thinks it may be related. Pt states every time she throws up she has to be seen because she can't stop, states she needs fluids and nausea medicine.

## 2019-03-16 NOTE — ED Provider Notes (Signed)
[facility] EMERGENCY DEPARTMENT Provider Note   CSN: [id number] Arrival date & time: 03/16/19  1330    History   Chief Complaint Chief Complaint  Patient presents with  . Abdominal Pain  . Nausea  . Emesis    HPI Ashlee Fisher is a 33 y.o. female who presents with vomiting and epigastric abdominal pain.  She has had episodes like this in the past.  She states that when she begins vomiting she has a very difficult time stopping.  She does admit to drinking heavily last night.  Today [birth year] her birthday.  She also admits to frequent marijuana use.  She states that sometimes hot showers help however today it did not.  She has been unable to hold down any foods or fluid.  She she notes some rectal tenesmus but has not had any diarrhea.  She denies fevers, chills, cough.     HPI  History reviewed. No pertinent past medical history.  Patient Active Problem List   Diagnosis Date Noted  . Morbid obesity (HCC) 02/01/2017    History reviewed. No pertinent surgical history.   OB History   No obstetric history on file.      Home Medications    Prior to Admission medications   Medication Sig Start Date End Date Taking? Authorizing Provider  amoxicillin (AMOXIL) 500 MG capsule Take 1 capsule (500 mg total) by mouth 2 (two) times daily. 06/08/18   McVey, Madelaine Bhat, PA-C  naproxen (NAPROSYN) 500 MG tablet Take 1 tablet (500 mg total) by mouth 2 (two) times daily with a meal. 04/18/18   Ofilia Neas, PA-C  traMADol (ULTRAM) 50 MG tablet Take 1 tablet (50 mg total) by mouth every 8 (eight) hours as needed (Take for breakthrough pain.). 04/18/18   Ofilia Neas, PA-C    Family History Family History  Problem Relation Age of Onset  . Diabetes Neg Hx     Social History Social History   Tobacco Use  . Smoking status: Never Smoker  . Smokeless tobacco: Never Used  Substance Use Topics  . Alcohol use: No  . Drug use: No     Allergies   Patient has  no known allergies.   Review of Systems Review of Systems Ten systems reviewed and are negative for acute change, except as noted in the HPI.    Physical Exam Updated Vital Signs BP (!) 130/93   Pulse 75   Temp 97.8 F (36.6 C) (Oral)   Resp 16   Ht 5\' 4"  (1.626 m)   Wt 86.2 kg   SpO2 100%   BMI 32.61 kg/m   Physical Exam Vitals signs and nursing note reviewed.  Constitutional:      General: She is not in acute distress.    Appearance: She is well-developed. She is not diaphoretic.  HENT:     Head: Normocephalic and atraumatic.  Eyes:     General: No scleral icterus.    Conjunctiva/sclera: Conjunctivae normal.  Neck:     Musculoskeletal: Normal range of motion.  Cardiovascular:     Rate and Rhythm: Normal rate and regular rhythm.     Heart sounds: Normal heart sounds. No murmur. No friction rub. No gallop.   Pulmonary:     Effort: Pulmonary effort is normal. No respiratory distress.     Breath sounds: Normal breath sounds.  Abdominal:     General: Bowel sounds are normal. There is no distension.     Palpations: Abdomen is soft.  There is no mass.     Tenderness: There is abdominal tenderness in the epigastric area. There is no guarding.  Skin:    General: Skin is warm and dry.  Neurological:     Mental Status: She is alert and oriented to person, place, and time.  Psychiatric:        Behavior: Behavior normal.      ED Treatments / Results  Labs (all labs ordered are listed, but only abnormal results are displayed) Labs Reviewed  CBC WITH DIFFERENTIAL/PLATELET  COMPREHENSIVE METABOLIC PANEL  URINALYSIS, ROUTINE W REFLEX MICROSCOPIC  LIPASE, BLOOD  RAPID URINE DRUG SCREEN, HOSP PERFORMED  MAGNESIUM  I-STAT BETA HCG BLOOD, ED ([facility], [facility], [facility] ONLY)    EKG None  Radiology No results found.  Procedures Procedures (including critical care time)  Medications Ordered in ED Medications  sodium chloride 0.9 % bolus 1,000 mL (has no administration in time  range)  haloperidol lactate (HALDOL) injection 2 mg (has no administration in time range)  famotidine (PEPCID) IVPB 20 mg premix (has no administration in time range)     Initial Impression / Assessment and Plan / ED Course  I have reviewed the triage vital signs and the nursing notes.  Pertinent labs & imaging results that were available during my care of the patient were reviewed by me and considered in my medical decision making (see chart for details).        Patient here with nausea and vomiting.  She has a history of marijuana abuse.  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  Patient also drinking heavily last night this may be a result of a hangover gastritis.  Her symptoms have resolved with a single dose of Haldol.  She has been tolerating p.o. fluids.  She has benign abdominal exam.  She is afebrile and hemodynamically stable.  Given fluids.  Labs are otherwise unremarkable.  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

## 2019-03-16 NOTE — ED Provider Notes (Signed)
Pt reevaluated.  Pt reports she feels much better.  Pt drinking gingerale without problem.    Elson Areas, [place] 03/16/19 1745    Arby Barrette, MD 03/25/19 [phone]

## 2019-03-16 NOTE — ED Notes (Signed)
Pt states she is feeling better and would like to go home.

## 2020-08-15 ENCOUNTER — Emergency Department (HOSPITAL_COMMUNITY)
Admission: EM | Admit: 2020-08-15 | Discharge: 2020-08-16 | Disposition: A | Discharge status: Home / Self Care | Payer: BC Managed Care – PPO | Attending: Emergency Medicine | Admitting: Emergency Medicine

## 2020-08-15 ENCOUNTER — Other Ambulatory Visit: Payer: Self-pay

## 2020-08-15 ENCOUNTER — Encounter (HOSPITAL_COMMUNITY): Payer: Self-pay | Admitting: Emergency Medicine

## 2020-08-15 ENCOUNTER — Emergency Department (HOSPITAL_COMMUNITY): Payer: BC Managed Care – PPO

## 2020-08-15 DIAGNOSIS — Y92410 Unspecified street and highway as the place of occurrence of the external cause: Secondary | ICD-10-CM | POA: Diagnosis not present

## 2020-08-15 DIAGNOSIS — Y9389 Activity, other specified: Secondary | ICD-10-CM | POA: Diagnosis not present

## 2020-08-15 DIAGNOSIS — Z23 Encounter for immunization: Secondary | ICD-10-CM | POA: Insufficient documentation

## 2020-08-15 DIAGNOSIS — S0181XA Laceration without foreign body of other part of head, initial encounter: Secondary | ICD-10-CM

## 2020-08-15 MED ORDER — LIDOCAINE HCL (PF) 1 % IJ SOLN
5.0000 mL | Freq: Once | INTRAMUSCULAR | Status: AC
  Administered 2020-08-15: 5 mL
  Filled 2020-08-15: qty 30

## 2020-08-15 MED ORDER — LIDOCAINE-EPINEPHRINE-TETRACAINE (LET) TOPICAL GEL
3.0000 mL | Freq: Once | TOPICAL | Status: AC
  Administered 2020-08-15: 3 mL via TOPICAL
  Filled 2020-08-15: qty 3
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
## 2020-08-16 MED ORDER — NAPROXEN 500 MG PO TABS: 500.0000 mg | ORAL_TABLET | Freq: Two times a day (BID) | ORAL | 0 refills | Status: AC | PRN

## 2020-08-16 MED ORDER — METHOCARBAMOL 500 MG PO TABS: 500.0000 mg | ORAL_TABLET | Freq: Three times a day (TID) | ORAL | 0 refills | Status: AC | PRN

## 2020-08-16 MED ORDER — TETANUS-DIPHTH-ACELL PERTUSSIS 5-2.5-18.5 LF-MCG/0.5 IM SUSP
0.5000 mL | Freq: Once | INTRAMUSCULAR | Status: AC
  Administered 2020-08-16: 0.5 mL via INTRAMUSCULAR
  Filled 2020-08-16: qty 0.5
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
## 2020-08-20 ENCOUNTER — Other Ambulatory Visit: Payer: Self-pay

## 2020-08-20 ENCOUNTER — Emergency Department (HOSPITAL_BASED_OUTPATIENT_CLINIC_OR_DEPARTMENT_OTHER)
Admission: EM | Admit: 2020-08-20 | Discharge: 2020-08-20 | Disposition: A | Discharge status: Home / Self Care | Payer: BC Managed Care – PPO | Attending: Emergency Medicine | Admitting: Emergency Medicine

## 2020-08-20 ENCOUNTER — Encounter (HOSPITAL_BASED_OUTPATIENT_CLINIC_OR_DEPARTMENT_OTHER): Payer: Self-pay | Admitting: *Deleted

## 2020-08-20 DIAGNOSIS — Z4802 Encounter for removal of sutures: Secondary | ICD-10-CM | POA: Insufficient documentation
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
## 2020-08-20 NOTE — ED Notes (Signed)
Dressed healed wound with gauze and tape per pt request after suture removal

## 2022-02-19 IMAGING — CR DG SHOULDER 2+V*R*
4 series · 4 of 4 positions shown · non-contrast
Comparison: None.

CLINICAL DATA: Pain

EXAM:
RIGHT SHOULDER - 2+ VIEW

[w shoulder external right (1 of 2)]
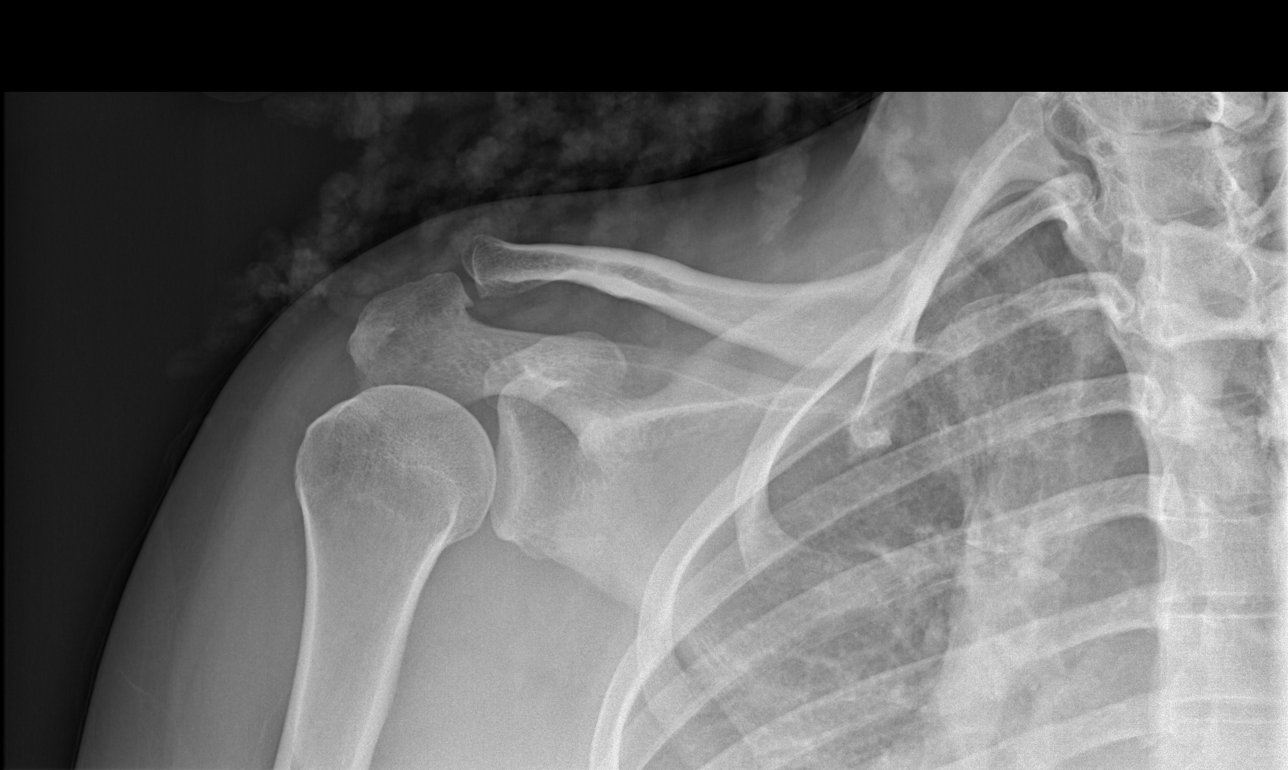

[w shoulder y-view right]
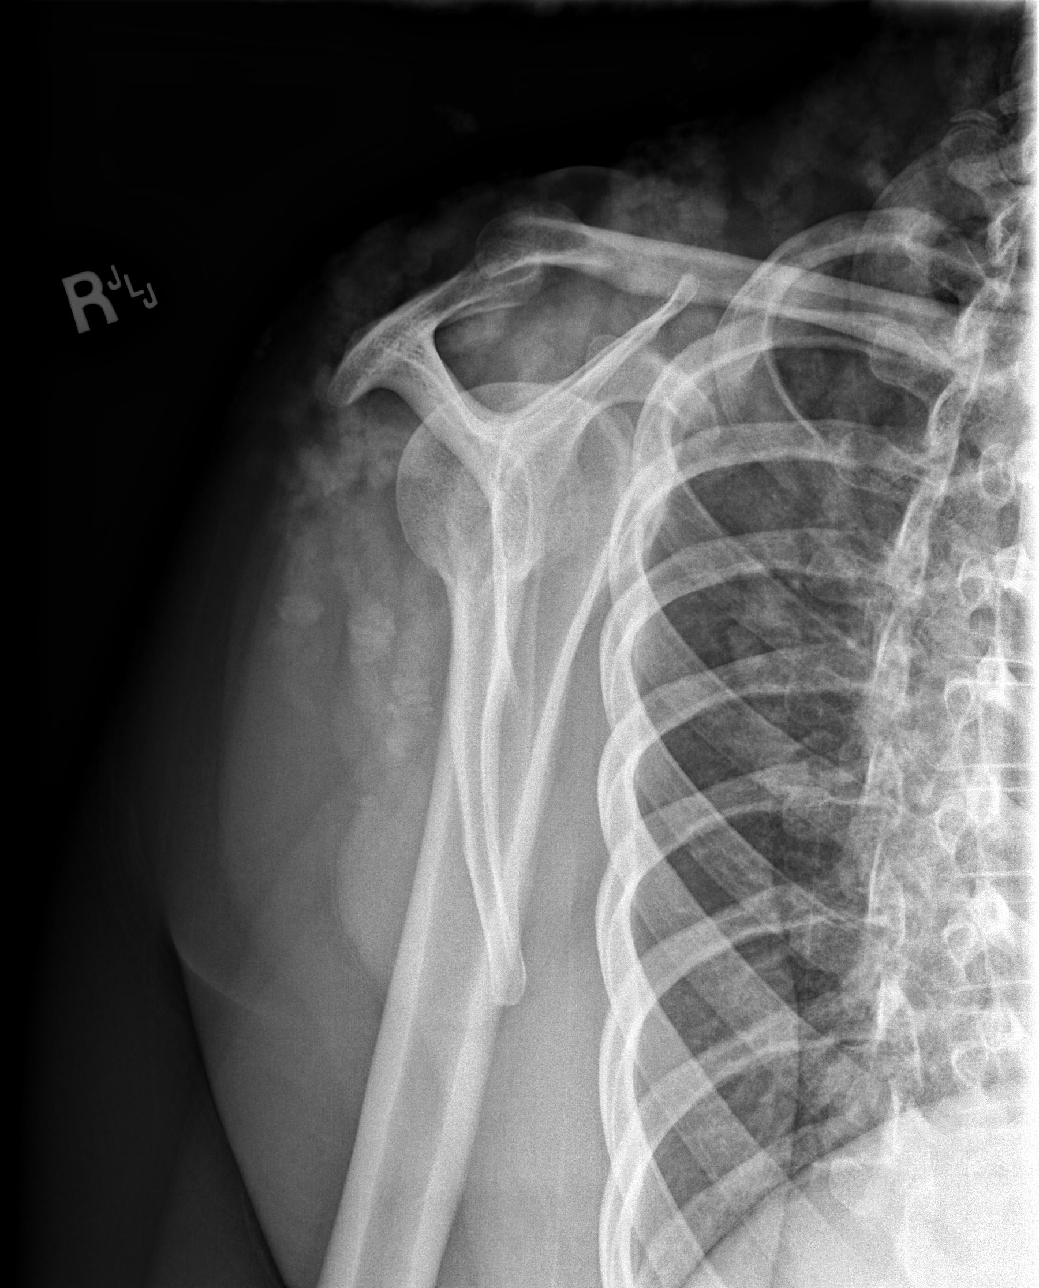

[w shoulder external right (2 of 2)]
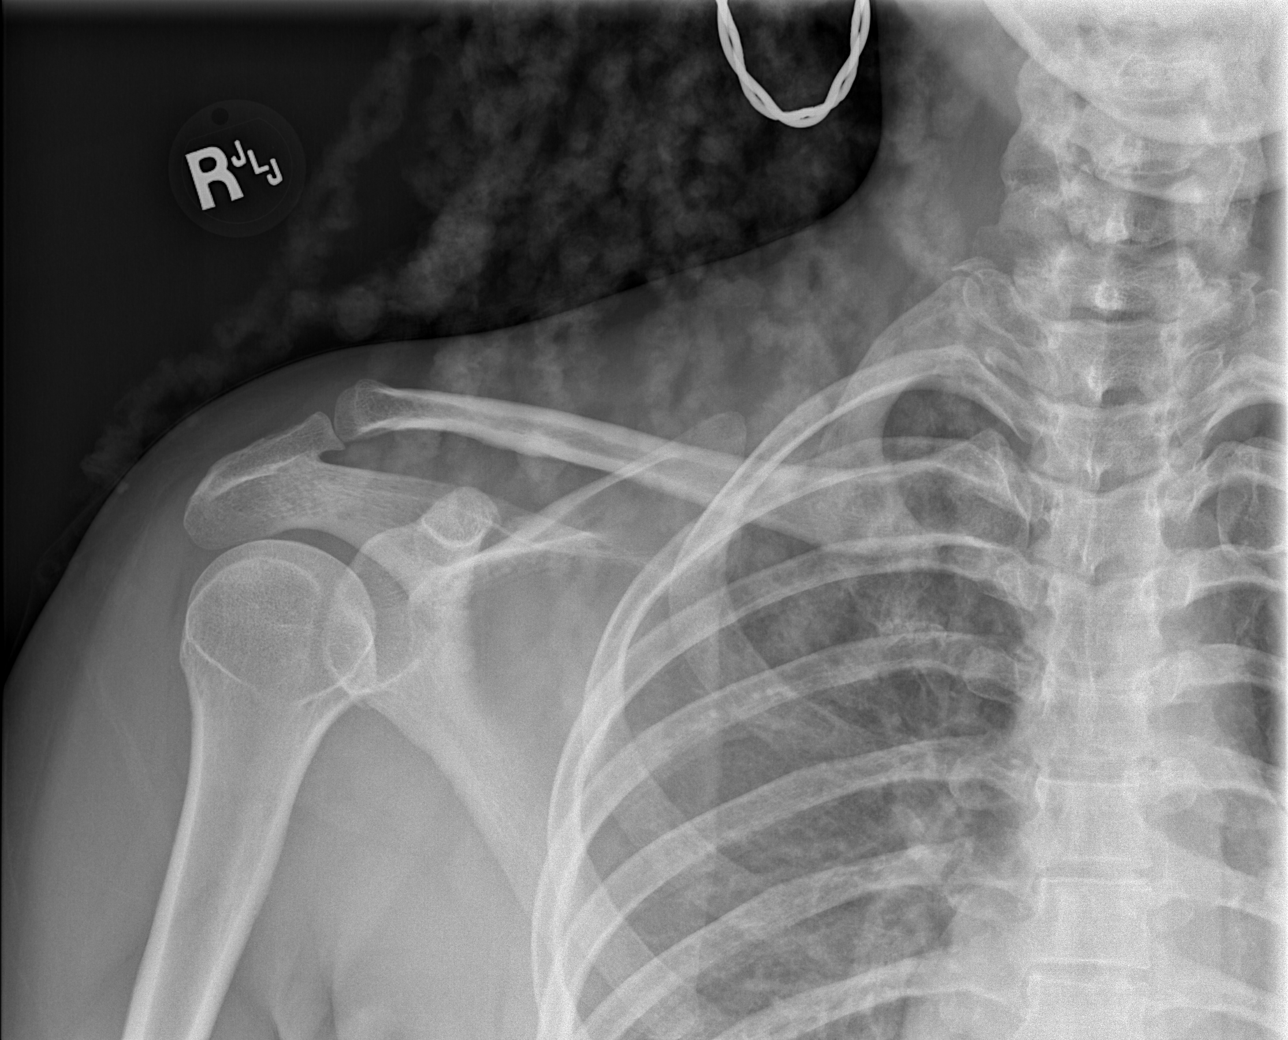

[x shoulder axillary right]
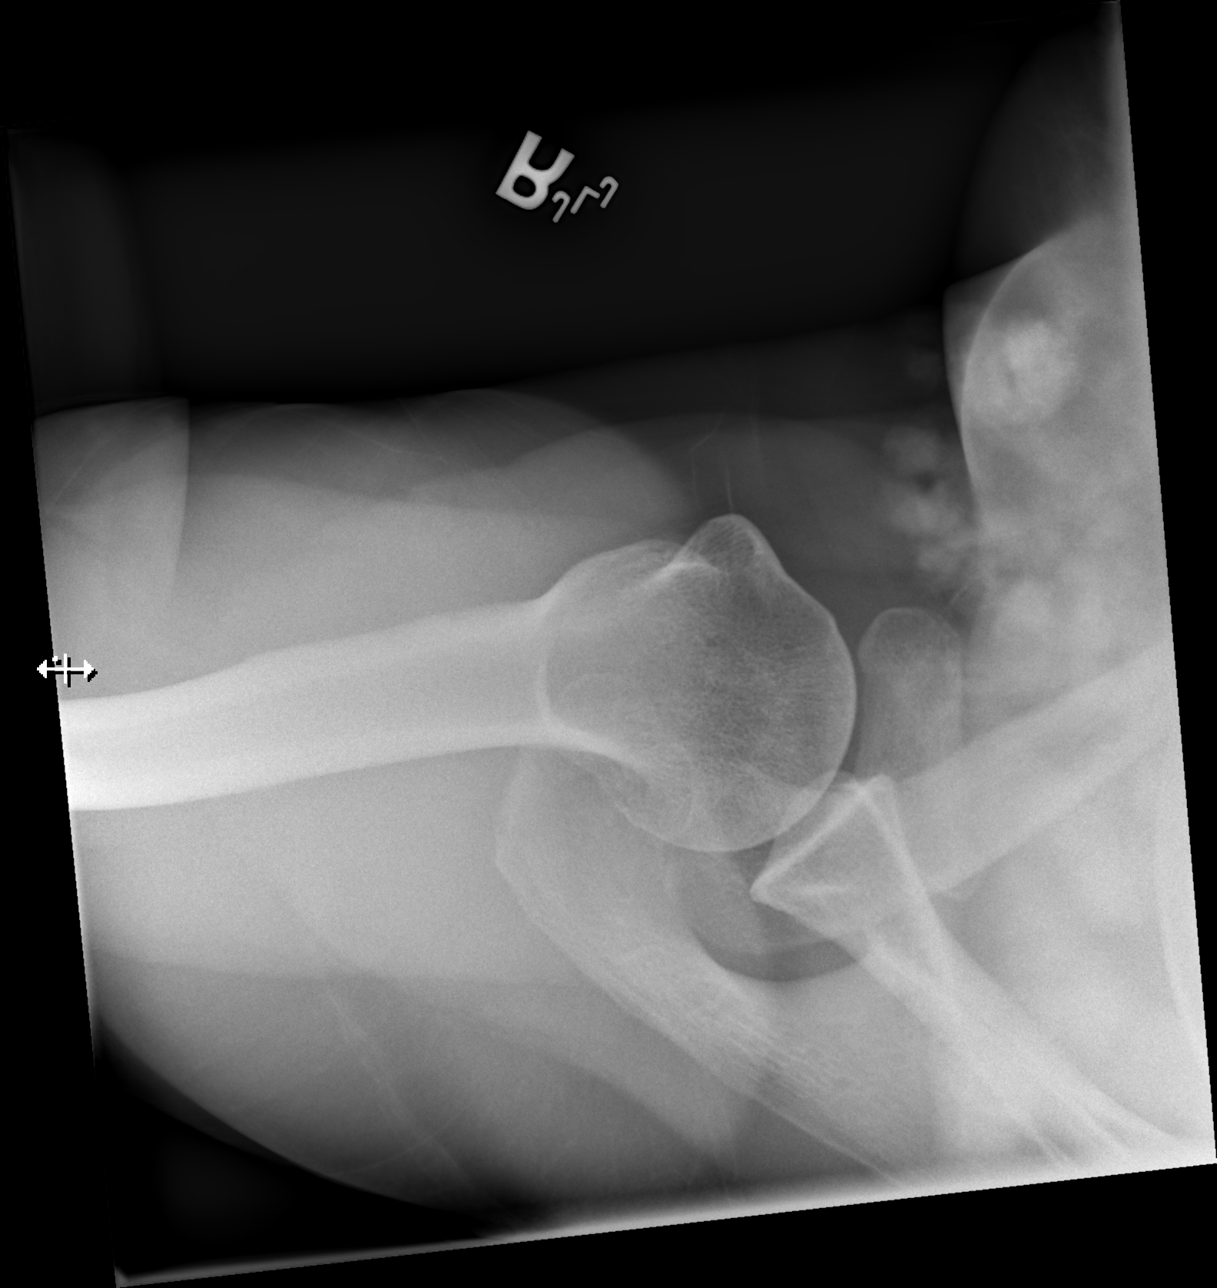

[4 of 4 positions shown; findings below may reference images not displayed]

FINDINGS: There is no evidence of fracture or dislocation. There is no
evidence of arthropathy or other focal bone abnormality. Soft
tissues are unremarkable.
IMPRESSION: Negative.

## 2022-10-08 ENCOUNTER — Encounter: Payer: Self-pay | Admitting: Podiatry

## 2022-10-10 ENCOUNTER — Ambulatory Visit: Payer: Self-pay | Admitting: Podiatry

## 2023-07-30 ENCOUNTER — Ambulatory Visit: Payer: BC Managed Care – PPO

## 2023-11-14 ENCOUNTER — Other Ambulatory Visit: Payer: Self-pay | Admitting: Obstetrics and Gynecology

## 2023-11-14 DIAGNOSIS — R928 Other abnormal and inconclusive findings on diagnostic imaging of breast: Secondary | ICD-10-CM

## 2023-12-05 ENCOUNTER — Other Ambulatory Visit: Payer: Self-pay

## 2023-12-11 ENCOUNTER — Other Ambulatory Visit: Payer: Self-pay | Admitting: Obstetrics and Gynecology

## 2023-12-11 ENCOUNTER — Ambulatory Visit
Admission: RE | Admit: 2023-12-11 | Discharge: 2023-12-11 | Disposition: A | Discharge status: Home / Self Care | Payer: 59 | Source: Ambulatory Visit | Attending: Obstetrics and Gynecology | Admitting: Obstetrics and Gynecology

## 2023-12-11 ENCOUNTER — Ambulatory Visit
Admission: RE | Admit: 2023-12-11 | Discharge: 2023-12-11 | Disposition: A | Discharge status: Home / Self Care | Payer: Self-pay | Source: Ambulatory Visit | Attending: Obstetrics and Gynecology

## 2023-12-11 DIAGNOSIS — R928 Other abnormal and inconclusive findings on diagnostic imaging of breast: Secondary | ICD-10-CM

## 2023-12-11 DIAGNOSIS — N6310 Unspecified lump in the right breast, unspecified quadrant: Secondary | ICD-10-CM

## 2023-12-15 ENCOUNTER — Ambulatory Visit
Admission: RE | Admit: 2023-12-15 | Discharge: 2023-12-15 | Disposition: A | Discharge status: Home / Self Care | Payer: 59 | Source: Ambulatory Visit | Attending: Obstetrics and Gynecology

## 2023-12-15 ENCOUNTER — Ambulatory Visit
Admission: RE | Admit: 2023-12-15 | Discharge: 2023-12-15 | Disposition: A | Discharge status: Home / Self Care | Payer: 59 | Source: Ambulatory Visit | Attending: Obstetrics and Gynecology | Admitting: Obstetrics and Gynecology

## 2023-12-15 DIAGNOSIS — N6310 Unspecified lump in the right breast, unspecified quadrant: Secondary | ICD-10-CM

## 2023-12-15 DIAGNOSIS — R928 Other abnormal and inconclusive findings on diagnostic imaging of breast: Secondary | ICD-10-CM

## 2023-12-15 HISTORY — PX: BREAST BIOPSY: SHX20

## 2023-12-18 LAB — SURGICAL PATHOLOGY

## 2024-07-22 ENCOUNTER — Other Ambulatory Visit: Payer: Self-pay

## 2024-07-22 ENCOUNTER — Encounter: Payer: Self-pay | Admitting: Emergency Medicine

## 2024-07-22 ENCOUNTER — Emergency Department
Admission: EM | Admit: 2024-07-22 | Discharge: 2024-07-22 | Disposition: A | Discharge status: Home / Self Care | Attending: Emergency Medicine | Admitting: Emergency Medicine

## 2024-07-22 DIAGNOSIS — R102 Pelvic and perineal pain: Secondary | ICD-10-CM | POA: Diagnosis not present

## 2024-07-22 DIAGNOSIS — R112 Nausea with vomiting, unspecified: Secondary | ICD-10-CM | POA: Insufficient documentation

## 2024-07-22 LAB — RESP PANEL BY RT-PCR (RSV, FLU A&B, COVID)  RVPGX2
Influenza A by PCR: NEGATIVE
Influenza B by PCR: NEGATIVE
Resp Syncytial Virus by PCR: NEGATIVE
SARS Coronavirus 2 by RT PCR: NEGATIVE

## 2024-07-22 LAB — LIPASE, BLOOD: Lipase: 26 U/L (ref 11–51)

## 2024-07-22 LAB — URINALYSIS, ROUTINE W REFLEX MICROSCOPIC
Bilirubin Urine: NEGATIVE
Glucose, UA: NEGATIVE mg/dL
Ketones, ur: 20 mg/dL — AB
Leukocytes,Ua: NEGATIVE
Nitrite: NEGATIVE
Protein, ur: NEGATIVE mg/dL
RBC / HPF: >50 RBC/hpf (ref 0–5)
Specific Gravity, Urine: 1.025 (ref 1.005–1.030)
pH: 8 (ref 5.0–8.0)

## 2024-07-22 LAB — COMPREHENSIVE METABOLIC PANEL WITH GFR
ALT: 13 U/L (ref 0–44)
AST: 19 U/L (ref 15–41)
Albumin: 3.8 g/dL (ref 3.5–5.0)
Alkaline Phosphatase: 42 U/L (ref 38–126)
Anion gap: 11 (ref 5–15)
BUN: 15 mg/dL (ref 6–20)
CO2: 21 mmol/L — ABNORMAL LOW (ref 22–32)
Calcium: 9.3 mg/dL (ref 8.9–10.3)
Chloride: 107 mmol/L (ref 98–111)
Creatinine, Ser: 0.66 mg/dL (ref 0.44–1.00)
GFR, Estimated: >60 mL/min (ref >60)
Glucose, Bld: 116 mg/dL — ABNORMAL HIGH (ref 70–99)
Potassium: 4.1 mmol/L (ref 3.5–5.1)
Sodium: 139 mmol/L (ref 135–145)
Total Bilirubin: 0.4 mg/dL (ref 0.0–1.2)
Total Protein: 7.4 g/dL (ref 6.5–8.1)

## 2024-07-22 LAB — CBC
HCT: 38.2 % (ref 36.0–46.0)
Hemoglobin: 12 g/dL (ref 12.0–15.0)
MCH: 27.8 pg (ref 26.0–34.0)
MCHC: 31.4 g/dL (ref 30.0–36.0)
MCV: 88.4 fL (ref 80.0–100.0)
Platelets: 238 K/uL (ref 150–400)
RBC: 4.32 MIL/uL (ref 3.87–5.11)
RDW: 13.8 % (ref 11.5–15.5)
WBC: 5.6 K/uL (ref 4.0–10.5)
nRBC: 0 % (ref 0.0–0.2)

## 2024-07-22 LAB — POC URINE PREG, ED: Preg Test, Ur: NEGATIVE

## 2024-07-22 MED ORDER — ONDANSETRON 4 MG PO TBDP: 4.0000 mg | ORAL_TABLET | Freq: Three times a day (TID) | ORAL | 0 refills | Status: AC | PRN

## 2024-07-22 MED ORDER — METOCLOPRAMIDE HCL 5 MG/ML IJ SOLN
10.0000 mg | Freq: Once | INTRAMUSCULAR | Status: AC
  Administered 2024-07-22: 10 mg via INTRAVENOUS
  Filled 2024-07-22: qty 2

## 2024-07-22 MED ORDER — SODIUM CHLORIDE 0.9 % IV SOLN
12.5000 mg | Freq: Once | INTRAVENOUS | Status: AC
  Administered 2024-07-22: 12.5 mg via INTRAVENOUS
  Filled 2024-07-22: qty 0.5

## 2024-07-22 MED ORDER — SODIUM CHLORIDE 0.9 % IV BOLUS
1000.0000 mL | Freq: Once | INTRAVENOUS | Status: AC
  Administered 2024-07-22: 1000 mL via INTRAVENOUS

## 2024-07-22 MED ORDER — ONDANSETRON 4 MG PO TBDP
4.0000 mg | ORAL_TABLET | Freq: Once | ORAL | Status: AC | PRN
  Administered 2024-07-22: 4 mg via ORAL
  Filled 2024-07-22: qty 1

## 2024-07-22 NOTE — Discharge Instructions (Addendum)
 Follow-up with your primary care provider

## 2024-07-22 NOTE — ED Triage Notes (Signed)
 Nausea and committing started this morning. Heavier cycle with more cramps today than usual. Took aleve  this morning for cramping prior to eating and that is when nausea started.

## 2024-07-22 NOTE — ED Provider Notes (Signed)
 [facility] Provider Note    Event Date/Time   First MD Initiated Contact with Patient 07/22/24 1158     (approximate)   History   Emesis and Nausea   HPI  Ashlee Fisher is a 38 y.o. female with no significant past medical history and as listed in EMR presents to the emergency department for treatment and evaluation of nausea and vomiting since this morning.  She is also having heavier menstrual cycle than normal and is experiencing pelvic cramping.  She took Aleve  this morning for cramping before she wait and labs when she began to feel nauseous.  No known fever.  No diarrhea.  No known sick contacts.     Physical Exam    Vitals:   07/22/24 1511 07/22/24 1713  BP: 136/82 135/86  Pulse: 64 65  Resp: 18 17  Temp: 97.7 F (36.5 C) 97.7 F (36.5 C)  SpO2: 100% 100%    General: Awake, no distress.  CV:  Good peripheral perfusion.  Resp:  Normal effort.  Abd:  No distention.  Soft Other:     ED Results / Procedures / Treatments   Labs (all labs ordered are listed, but only abnormal results are displayed)  Labs Reviewed  COMPREHENSIVE METABOLIC PANEL WITH GFR - Abnormal; Notable for the following components:      Result Value   CO2 21 (*)    Glucose, Bld 116 (*)    All other components within normal limits  URINALYSIS, ROUTINE W REFLEX MICROSCOPIC - Abnormal; Notable for the following components:   Color, Urine YELLOW (*)    APPearance HAZY (*)    Hgb urine dipstick MODERATE (*)    Ketones, ur 20 (*)    Bacteria, UA RARE (*)    All other components within normal limits  RESP PANEL BY RT-PCR (RSV, FLU A&B, COVID)  RVPGX2  LIPASE, BLOOD  CBC  POC URINE PREG, ED     EKG  Not indicated   RADIOLOGY  Image and radiology report reviewed and interpreted by me. Radiology report consistent with the same.  Not indicated  PROCEDURES:  Critical Care performed: No  Procedures   MEDICATIONS ORDERED IN ED:  Medications   ondansetron  (ZOFRAN -ODT) disintegrating tablet 4 mg (4 mg Oral Given 07/22/24 1125)  sodium chloride  0.9 % bolus 1,000 mL (0 mLs Intravenous Stopped 07/22/24 1713)  metoCLOPramide  (REGLAN ) injection 10 mg (10 mg Intravenous Given 07/22/24 1423)  promethazine  (PHENERGAN ) 12.5 mg in sodium chloride  0.9 % 50 mL IVPB (0 mg Intravenous Stopped 07/22/24 1713)     IMPRESSION / MDM / ASSESSMENT AND PLAN / ED COURSE   I have reviewed the triage note and vital signs. Vital signs are stable   Differential diagnosis includes, but is not limited to, medication side effect, acute viral syndrome, gastroenteritis, COVID, influenza, RSV, dehydration, hyperemesis, pregnancy, miscarriage  Patient's presentation is most consistent with acute illness / injury with system symptoms.  38 year old female presenting to the emergency department for treatment and evaluation of nausea and vomiting that started this morning. See HPI.  Patient was a difficult IV stick however IV team was able to establish a line and IV fluids are infusing.  Patient continues to experience nausea even after Reglan  and Zofran .  Phenergan  ordered.  Patient care transferred to [facility], PA-C who will reassess patient after Phenergan  infusion has completed with likely plan of discharge afterward.      FINAL CLINICAL IMPRESSION(S) / ED DIAGNOSES   Final diagnoses:  Nausea and vomiting, unspecified vomiting type     Rx / DC Orders   ED Discharge Orders          Ordered    ondansetron  (ZOFRAN -ODT) 4 MG disintegrating tablet  Every 8 hours PRN        07/22/24 1709             Note:  This document was prepared using Dragon voice recognition software and may include unintentional dictation errors.   Herlinda Kirk NOVAK, FNP 07/23/24 9153    Bradler, Evan K, MD 07/23/24 [phone]

## 2024-07-22 NOTE — ED Provider Notes (Signed)
-----------------------------------------   3:09 PM on 07/22/2024 -----------------------------------------  Blood pressure 136/82, pulse 64, temperature 97.7 F (36.5 C), temperature source Oral, resp. rate 18, height 5' 4 (1.626 m), weight 97.5 kg, last menstrual period 07/22/2024, SpO2 100%.  Assuming care from [facility], NP.  In short, Ashlee Fisher is a 38 y.o. female with a chief complaint of Emesis and Nausea .  Refer to the original H&P for additional details.  The current plan of care is to reassess patient following administration of Phenergan  and IV fluids.  ----------------------------------------- 5:10 PM on 07/22/2024 -----------------------------------------  Patient reports she feels much better.  Patient stable at discharge.  Zofran  sent to pharmacy.         Margrette Monte A, PA-C 07/22/24 1710    [name] LABOR, MD 07/23/24 [phone]

## 2024-07-24 ENCOUNTER — Emergency Department
Admission: EM | Admit: 2024-07-24 | Discharge: 2024-07-24 | Disposition: A | Discharge status: Home / Self Care | Attending: Emergency Medicine | Admitting: Emergency Medicine

## 2024-07-24 ENCOUNTER — Other Ambulatory Visit: Payer: Self-pay

## 2024-07-24 DIAGNOSIS — R1115 Cyclical vomiting syndrome unrelated to migraine: Secondary | ICD-10-CM | POA: Insufficient documentation

## 2024-07-24 DIAGNOSIS — R1013 Epigastric pain: Secondary | ICD-10-CM | POA: Insufficient documentation

## 2024-07-24 DIAGNOSIS — E876 Hypokalemia: Secondary | ICD-10-CM | POA: Diagnosis not present

## 2024-07-24 DIAGNOSIS — R1110 Vomiting, unspecified: Secondary | ICD-10-CM | POA: Diagnosis present

## 2024-07-24 LAB — CBC
HCT: 39.9 % (ref 36.0–46.0)
Hemoglobin: 12.7 g/dL (ref 12.0–15.0)
MCH: 27.5 pg (ref 26.0–34.0)
MCHC: 31.8 g/dL (ref 30.0–36.0)
MCV: 86.6 fL (ref 80.0–100.0)
Platelets: 254 K/uL (ref 150–400)
RBC: 4.61 MIL/uL (ref 3.87–5.11)
RDW: 13.7 % (ref 11.5–15.5)
WBC: 5.9 K/uL (ref 4.0–10.5)
nRBC: 0 % (ref 0.0–0.2)

## 2024-07-24 LAB — LIPASE, BLOOD: Lipase: 29 U/L (ref 11–51)

## 2024-07-24 LAB — COMPREHENSIVE METABOLIC PANEL WITH GFR
ALT: 12 U/L (ref 0–44)
AST: 18 U/L (ref 15–41)
Albumin: 3.9 g/dL (ref 3.5–5.0)
Alkaline Phosphatase: 43 U/L (ref 38–126)
Anion gap: 12 (ref 5–15)
BUN: 14 mg/dL (ref 6–20)
CO2: 22 mmol/L (ref 22–32)
Calcium: 8.8 mg/dL — ABNORMAL LOW (ref 8.9–10.3)
Chloride: 102 mmol/L (ref 98–111)
Creatinine, Ser: 0.77 mg/dL (ref 0.44–1.00)
GFR, Estimated: >60 mL/min (ref >60)
Glucose, Bld: 98 mg/dL (ref 70–99)
Potassium: 3 mmol/L — ABNORMAL LOW (ref 3.5–5.1)
Sodium: 136 mmol/L (ref 135–145)
Total Bilirubin: 0.7 mg/dL (ref 0.0–1.2)
Total Protein: 7.5 g/dL (ref 6.5–8.1)

## 2024-07-24 LAB — MAGNESIUM: Magnesium: 2 mg/dL (ref 1.7–2.4)

## 2024-07-24 MED ORDER — DROPERIDOL 2.5 MG/ML IJ SOLN
2.5000 mg | Freq: Once | INTRAMUSCULAR | Status: AC
  Administered 2024-07-24: 2.5 mg via INTRAMUSCULAR
  Filled 2024-07-24: qty 2

## 2024-07-24 MED ORDER — FAMOTIDINE 20 MG PO TABS
20.0000 mg | ORAL_TABLET | Freq: Once | ORAL | Status: AC
  Administered 2024-07-24: 20 mg via ORAL
  Filled 2024-07-24: qty 1

## 2024-07-24 MED ORDER — FAMOTIDINE 20 MG PO TABS: 20.0000 mg | ORAL_TABLET | Freq: Two times a day (BID) | ORAL | 0 refills | Status: AC

## 2024-07-24 MED ORDER — POTASSIUM CHLORIDE 20 MEQ PO PACK
40.0000 meq | PACK | Freq: Once | ORAL | Status: AC
  Administered 2024-07-24: 40 meq via ORAL
  Filled 2024-07-24: qty 2

## 2024-07-24 NOTE — Discharge Instructions (Addendum)
 You are seen in the emergency department for nausea and vomiting.  Follow-up closely with your primary care physician.  You are started on an acid reducing medication.  You can use your nausea medication as needed.  Your potassium was mildly low when checked today.  Make sure to follow up with a primary doctor to follow up your labs.  Make sure to eat food high in potassium and magnesium - examples - potatoes, spinach, bananas, beans, avocadoes, oranges, nuts.  zofran  (ondansetron ) - nausea medication, take 1 tablet every 8 hours as needed for nausea/vomiting.  You can take a second tablet within 8 hours.  Do not take more than 8 mg in 8 hours.

## 2024-07-24 NOTE — ED Provider Notes (Signed)
 [facility] Provider Note    Event Date/Time   First MD Initiated Contact with Patient 07/24/24 [phone]     (approximate)   History   Vomiting   HPI  Ashlee Fisher is a 37 y.o. female presents to the emergency department nausea and vomiting.  Patient states that she was seen a couple of days ago with similar episodes and she is concerned she might have a cyclical vomiting syndrome.  States that she is currently on her menstrual cycle.  Took ibuprofen the other day without eating and thought that might have triggered her nausea and vomiting.  Denies any lower abdominal pain.  No fever or chills.  No diarrhea no blood in her stool.  Does endorse regular marijuana use.  Denies any significant alcohol use.  Denies any concern for pregnancy.  No dysuria, urinary urgency or frequency.     Physical Exam   Triage Vital Signs: ED Triage Vitals  Encounter Vitals Group     BP 07/24/24 0853 (!) 141/93     Girls Systolic BP Percentile --      Girls Diastolic BP Percentile --      Boys Systolic BP Percentile --      Boys Diastolic BP Percentile --      Pulse Rate 07/24/24 0853 61     Resp 07/24/24 0853 16     Temp 07/24/24 0853 98.4 F (36.9 C)     Temp Source 07/24/24 0853 Oral     SpO2 07/24/24 0853 99 %     Weight 07/24/24 0851 214 lb 15.2 oz (97.5 kg)     Height --      Head Circumference --      Peak Flow --      Pain Score 07/24/24 0851 5     Pain Loc --      Pain Education --      Exclude from Growth Chart --     Most recent vital signs: Vitals:   07/24/24 0853 07/24/24 1121  BP: (!) 141/93   Pulse: 61   Resp: 16   Temp: 98.4 F (36.9 C)   SpO2: 99% 100%    Physical Exam Constitutional:      Appearance: She is well-developed.  HENT:     Head: Atraumatic.  Eyes:     Conjunctiva/sclera: Conjunctivae normal.  Cardiovascular:     Rate and Rhythm: Regular rhythm.  Pulmonary:     Effort: No respiratory distress.  Abdominal:     General:  There is no distension.     Tenderness: There is abdominal tenderness (Mild epigastric abdominal tenderness but no rebound or guarding.  Negative Murphy sign.).  Musculoskeletal:        General: Normal range of motion.     Cervical back: Normal range of motion.  Skin:    General: Skin is warm.  Neurological:     Mental Status: She is alert. Mental status is at baseline.     IMPRESSION / MDM / ASSESSMENT AND PLAN / ED COURSE  I reviewed the triage vital signs and the nursing notes.  Differential diagnosis including cyclical vomiting secondary to cannabis, gastritis/PUD, pregnancy, electrolyte abnormality, dehydration.  Have a low suspicion for ACS.  Low suspicion for acute cholecystitis, no right upper quadrant abdominal tenderness to palpation.  Low suspicion for acute appendicitis, no lower abdominal tenderness on exam.  On chart review pregnancy test was -2 days ago  No tachycardic or bradycardic dysrhythmias while on cardiac telemetry.  LABS (all labs ordered are listed, but only abnormal results are displayed) Labs interpreted as -    Labs Reviewed  COMPREHENSIVE METABOLIC PANEL WITH GFR - Abnormal; Notable for the following components:      Result Value   Potassium 3.0 (*)    Calcium 8.8 (*)    All other components within normal limits  LIPASE, BLOOD  CBC  MAGNESIUM  URINALYSIS, ROUTINE W REFLEX MICROSCOPIC  POC URINE PREG, ED     MDM  No significant leukocytosis.  Mildly low potassium at 3.  Normal magnesium level.  Normal lipase.  Patient was given IM droperidol .  On reevaluation feeling much better and states that her nausea has nearly resolved.  Given a PPI and tolerating p.o. with potassium repletion.  Able to tolerate p.o.  Most likely with cyclic vomiting.  Long discussion about marijuana cessation.  Will do a short course of a PPI.  States that she will follow-up with her primary care physician already has Zofran  at home.  Discussed return precautions for  any worsening symptoms.     PROCEDURES:  Critical Care performed: No  Procedures  Patient's presentation is most consistent with acute complicated illness / injury requiring diagnostic workup.   MEDICATIONS ORDERED IN ED: Medications  droperidol  (INAPSINE ) 2.5 MG/ML injection 2.5 mg (2.5 mg Intramuscular Given 07/24/24 1030)  famotidine  (PEPCID ) tablet 20 mg (20 mg Oral Given 07/24/24 1030)  potassium chloride  (KLOR-CON ) packet 40 mEq (40 mEq Oral Given 07/24/24 1120)    FINAL CLINICAL IMPRESSION(S) / ED DIAGNOSES   Final diagnoses:  Cyclical vomiting syndrome not associated with migraine  Hypokalemia     Rx / DC Orders   ED Discharge Orders          Ordered    famotidine  (PEPCID ) 20 MG tablet  2 times daily        07/24/24 1140             Note:  This document was prepared using Dragon voice recognition software and may include unintentional dictation errors.   Suzanne Kirsch, MD 07/24/24 1143

## 2024-07-24 NOTE — ED Triage Notes (Signed)
 C/O nausea and vomiting since Monday. Seen Monday for same. Patient is concerned she is having cyclic vomiting syndrome.  C/O feeling nauseous and weak. Taking Zofran , but no relief of symptoms.  AAOx3. Skin warm and dry. NAD
